# Patient Record
Sex: Female | Born: 1970 | Race: Black or African American | Hispanic: No | Marital: Married | Smoking: Never smoker
Health system: Southern US, Community
[De-identification: ages and names within clinical notes are randomized; demographics above are authoritative.]

## PROBLEM LIST (undated history)

## (undated) DIAGNOSIS — R748 Abnormal levels of other serum enzymes: Secondary | ICD-10-CM

## (undated) DIAGNOSIS — T7840XA Allergy, unspecified, initial encounter: Secondary | ICD-10-CM

## (undated) DIAGNOSIS — D696 Thrombocytopenia, unspecified: Secondary | ICD-10-CM

## (undated) DIAGNOSIS — N83209 Unspecified ovarian cyst, unspecified side: Secondary | ICD-10-CM

## (undated) DIAGNOSIS — E559 Vitamin D deficiency, unspecified: Secondary | ICD-10-CM

## (undated) DIAGNOSIS — Z8489 Family history of other specified conditions: Secondary | ICD-10-CM

## (undated) DIAGNOSIS — E785 Hyperlipidemia, unspecified: Secondary | ICD-10-CM

## (undated) DIAGNOSIS — B0229 Other postherpetic nervous system involvement: Secondary | ICD-10-CM

## (undated) HISTORY — DX: Allergy, unspecified, initial encounter: T78.40XA

## (undated) HISTORY — DX: Hyperlipidemia, unspecified: E78.5

## (undated) HISTORY — DX: Thrombocytopenia, unspecified: D69.6

## (undated) HISTORY — DX: Unspecified ovarian cyst, unspecified side: N83.209

## (undated) HISTORY — DX: Abnormal levels of other serum enzymes: R74.8

## (undated) HISTORY — DX: Vitamin D deficiency, unspecified: E55.9

---

## 2000-04-18 HISTORY — PX: TUBAL LIGATION: SHX77

## 2001-04-18 HISTORY — PX: BREAST SURGERY: SHX581

## 2004-02-24 ENCOUNTER — Emergency Department: Payer: Self-pay | Admitting: Internal Medicine

## 2006-11-02 ENCOUNTER — Emergency Department: Payer: Self-pay | Admitting: Emergency Medicine

## 2009-02-12 ENCOUNTER — Ambulatory Visit: Payer: Self-pay

## 2009-06-16 ENCOUNTER — Ambulatory Visit: Payer: Self-pay | Admitting: Internal Medicine

## 2009-07-13 ENCOUNTER — Ambulatory Visit: Payer: Self-pay | Admitting: Internal Medicine

## 2009-07-17 ENCOUNTER — Ambulatory Visit: Payer: Self-pay | Admitting: Internal Medicine

## 2009-08-16 ENCOUNTER — Ambulatory Visit: Payer: Self-pay | Admitting: Internal Medicine

## 2009-09-16 ENCOUNTER — Ambulatory Visit: Payer: Self-pay | Admitting: Internal Medicine

## 2009-09-28 ENCOUNTER — Ambulatory Visit: Payer: Self-pay | Admitting: Internal Medicine

## 2009-10-16 ENCOUNTER — Ambulatory Visit: Payer: Self-pay | Admitting: Internal Medicine

## 2009-11-17 ENCOUNTER — Ambulatory Visit: Payer: Self-pay | Admitting: Internal Medicine

## 2009-12-17 ENCOUNTER — Ambulatory Visit: Payer: Self-pay | Admitting: Internal Medicine

## 2010-01-16 ENCOUNTER — Ambulatory Visit: Payer: Self-pay | Admitting: Internal Medicine

## 2010-03-15 ENCOUNTER — Ambulatory Visit: Payer: Self-pay | Admitting: Internal Medicine

## 2010-03-18 ENCOUNTER — Ambulatory Visit: Payer: Self-pay | Admitting: Internal Medicine

## 2010-04-05 ENCOUNTER — Ambulatory Visit: Payer: Self-pay

## 2010-07-30 ENCOUNTER — Ambulatory Visit: Payer: Self-pay | Admitting: Internal Medicine

## 2010-08-17 ENCOUNTER — Ambulatory Visit: Payer: Self-pay | Admitting: Internal Medicine

## 2011-04-13 ENCOUNTER — Ambulatory Visit: Payer: Self-pay

## 2012-04-16 ENCOUNTER — Ambulatory Visit: Payer: Self-pay

## 2012-06-14 ENCOUNTER — Ambulatory Visit: Payer: Self-pay | Admitting: Anesthesiology

## 2012-07-12 ENCOUNTER — Ambulatory Visit: Payer: Self-pay | Admitting: Anesthesiology

## 2012-07-31 ENCOUNTER — Ambulatory Visit: Payer: Self-pay | Admitting: Anesthesiology

## 2012-11-27 ENCOUNTER — Ambulatory Visit: Payer: Self-pay | Admitting: Anesthesiology

## 2013-04-17 ENCOUNTER — Ambulatory Visit: Payer: Self-pay

## 2013-11-05 ENCOUNTER — Ambulatory Visit: Payer: Self-pay | Admitting: Family Medicine

## 2014-06-09 ENCOUNTER — Ambulatory Visit: Payer: Self-pay

## 2014-06-19 ENCOUNTER — Ambulatory Visit: Payer: Self-pay

## 2014-06-27 ENCOUNTER — Ambulatory Visit: Payer: Self-pay | Admitting: Gastroenterology

## 2015-02-16 ENCOUNTER — Telehealth: Payer: Self-pay | Admitting: Family Medicine

## 2015-02-17 NOTE — Telephone Encounter (Signed)
Spoke with patient, she is requesting copies of he office notes from 2013. Printed and placed for her to pick up.

## 2015-02-17 NOTE — Telephone Encounter (Signed)
Pt would like a call back wouldn't tell me anything sorry

## 2015-03-09 ENCOUNTER — Telehealth: Payer: Self-pay

## 2015-03-09 NOTE — Telephone Encounter (Signed)
Pt mailed letter requesting a letter to return to work.  See copy on file.  LMOM advising patient that she will need to see a disability provider for determination.

## 2015-04-03 ENCOUNTER — Telehealth: Payer: Self-pay

## 2015-04-03 NOTE — Telephone Encounter (Signed)
Patient is requesting a letter stating that she is okay to return to work.  Patient has brought paper work (in my office) for you to review as supporting documentation for her request.

## 2015-04-06 NOTE — Telephone Encounter (Signed)
done

## 2015-04-07 NOTE — Telephone Encounter (Signed)
Please let Yolanda Tran know that I'd like to see patient for an appointment here in the office for:  Letter, but also cholesterol, general follow-up Please schedule a visit with me  in the next: few weeks Fasting?  Yes please Thank you, Dr. Sherie DonLada

## 2015-04-08 NOTE — Telephone Encounter (Signed)
Patient has appointment on 04/17/15.

## 2015-04-16 DIAGNOSIS — D696 Thrombocytopenia, unspecified: Secondary | ICD-10-CM | POA: Insufficient documentation

## 2015-04-16 DIAGNOSIS — E559 Vitamin D deficiency, unspecified: Secondary | ICD-10-CM | POA: Insufficient documentation

## 2015-04-16 DIAGNOSIS — R748 Abnormal levels of other serum enzymes: Secondary | ICD-10-CM | POA: Insufficient documentation

## 2015-04-16 DIAGNOSIS — E785 Hyperlipidemia, unspecified: Secondary | ICD-10-CM | POA: Insufficient documentation

## 2015-04-17 ENCOUNTER — Ambulatory Visit (INDEPENDENT_AMBULATORY_CARE_PROVIDER_SITE_OTHER): Payer: Self-pay | Admitting: Family Medicine

## 2015-04-17 ENCOUNTER — Encounter: Payer: Self-pay | Admitting: Family Medicine

## 2015-04-17 VITALS — BP 131/85 | HR 98 | Temp 97.9°F | Ht 65.5 in | Wt 132.0 lb

## 2015-04-17 DIAGNOSIS — M5417 Radiculopathy, lumbosacral region: Secondary | ICD-10-CM

## 2015-04-17 DIAGNOSIS — D696 Thrombocytopenia, unspecified: Secondary | ICD-10-CM

## 2015-04-17 DIAGNOSIS — R748 Abnormal levels of other serum enzymes: Secondary | ICD-10-CM

## 2015-04-17 DIAGNOSIS — E559 Vitamin D deficiency, unspecified: Secondary | ICD-10-CM

## 2015-04-17 DIAGNOSIS — E785 Hyperlipidemia, unspecified: Secondary | ICD-10-CM

## 2015-04-17 DIAGNOSIS — M5416 Radiculopathy, lumbar region: Secondary | ICD-10-CM

## 2015-04-17 NOTE — Assessment & Plan Note (Addendum)
Recheck that today along with alk phos isoenzymes

## 2015-04-17 NOTE — Assessment & Plan Note (Addendum)
Check CBC today; previously evaluated at the cancer center by hematologist
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
## 2015-04-17 NOTE — Assessment & Plan Note (Addendum)
Check fasting lipids today; she has not been taking statin for a long time

## 2015-04-17 NOTE — Assessment & Plan Note (Addendum)
Check vit D and supplement as needed

## 2015-04-17 NOTE — Patient Instructions (Addendum)
We are going to need records from: Dr. Marlowe AschoffKathryn Egerton Dr. Tressie StalkerJeffrey Jenkins Dr. Selina CooleyJames [facility] Dr. Jeannetta EllisBeschel chiropractor Disability Determination Services [place]

## 2015-04-18 ENCOUNTER — Encounter: Payer: Self-pay | Admitting: Family Medicine

## 2015-04-18 DIAGNOSIS — M5416 Radiculopathy, lumbar region: Secondary | ICD-10-CM | POA: Insufficient documentation

## 2015-04-18 LAB — CBC WITH DIFFERENTIAL/PLATELET
Basophils Absolute: 0 10*3/uL (ref 0.0–0.2)
Basos: 0 %
EOS (ABSOLUTE): 0.1 10*3/uL (ref 0.0–0.4)
Eos: 1 %
Hematocrit: 39.2 % (ref 34.0–46.6)
Hemoglobin: 13 g/dL (ref 11.1–15.9)
Immature Grans (Abs): 0 10*3/uL (ref 0.0–0.1)
Immature Granulocytes: 0 %
Lymphocytes Absolute: 1.6 10*3/uL (ref 0.7–3.1)
Lymphs: 23 %
MCH: 28.4 pg (ref 26.6–33.0)
MCHC: 33.2 g/dL (ref 31.5–35.7)
MCV: 86 fL (ref 79–97)
Monocytes Absolute: 0.6 10*3/uL (ref 0.1–0.9)
Monocytes: 9 %
Neutrophils Absolute: 4.4 10*3/uL (ref 1.4–7.0)
Neutrophils: 67 %
Platelets: 126 10*3/uL — ABNORMAL LOW (ref 150–379)
RBC: 4.57 x10E6/uL (ref 3.77–5.28)
RDW: 14.1 % (ref 12.3–15.4)
WBC: 6.6 10*3/uL (ref 3.4–10.8)

## 2015-04-18 LAB — LIPID PANEL W/O CHOL/HDL RATIO
Cholesterol, Total: 221 mg/dL — ABNORMAL HIGH (ref 100–199)
HDL: 61 mg/dL (ref >39)
LDL Calculated: 147 mg/dL — ABNORMAL HIGH (ref 0–99)
Triglycerides: 64 mg/dL (ref 0–149)
VLDL Cholesterol Cal: 13 mg/dL (ref 5–40)

## 2015-04-18 LAB — COMPREHENSIVE METABOLIC PANEL
ALT: 15 IU/L (ref 0–32)
AST: 22 IU/L (ref 0–40)
Albumin/Globulin Ratio: 1.9 (ref 1.1–2.5)
Albumin: 4.3 g/dL (ref 3.5–5.5)
Alkaline Phosphatase: 86 IU/L (ref 39–117)
BUN/Creatinine Ratio: 15 (ref 9–23)
BUN: 11 mg/dL (ref 6–24)
Bilirubin Total: 0.5 mg/dL (ref 0.0–1.2)
CO2: 22 mmol/L (ref 18–29)
Calcium: 8.8 mg/dL (ref 8.7–10.2)
Chloride: 103 mmol/L (ref 96–106)
Creatinine, Ser: 0.73 mg/dL (ref 0.57–1.00)
GFR calc Af Amer: 114 mL/min/{1.73_m2} (ref >59)
GFR calc non Af Amer: 99 mL/min/{1.73_m2} (ref >59)
Globulin, Total: 2.3 g/dL (ref 1.5–4.5)
Glucose: 87 mg/dL (ref 65–99)
Potassium: 4.1 mmol/L (ref 3.5–5.2)
Sodium: 141 mmol/L (ref 134–144)
Total Protein: 6.6 g/dL (ref 6.0–8.5)

## 2015-04-18 LAB — VITAMIN D 25 HYDROXY (VIT D DEFICIENCY, FRACTURES): Vit D, 25-Hydroxy: 40.5 ng/mL (ref 30.0–100.0)

## 2015-04-18 NOTE — Assessment & Plan Note (Signed)
Patient previously on short-term and then long-term disability for her back apparently; she filed for permanent disability and is now seeking letter saying she can return to work for some sort of mortgage relief; I explained that I am going to need records from these previous treating doctors, since I had nothing to do with her previous treatments or evaluations or work excuses or limitations; I am going to need more information before I completely reverse something that was apparently so bad that she filed for permanent disability to say that now everything is fine; I still do not know why permanent disability was applied for or why her neurosurgeon was not on board with this; we are going to request multiples notes from the neurosurgeon, the chiropractor, the pain clinic, the physical therapist; I am also going to need the MRI and lumbar spine xrays that were apparently done; patient may return in two weeks to go over what information we're able to obtain and do physical exam at that time to see if there are ongoing limitations

## 2015-04-21 LAB — ALKALINE PHOSPHATASE, ISOENZYMES
BONE FRACTION: 24 % (ref 14–68)
INTESTINAL FRAC.: 0 % (ref 0–18)
LIVER FRACTION: 76 % (ref 18–85)

## 2015-05-01 ENCOUNTER — Ambulatory Visit (INDEPENDENT_AMBULATORY_CARE_PROVIDER_SITE_OTHER): Payer: BLUE CROSS/BLUE SHIELD | Admitting: Family Medicine

## 2015-05-01 ENCOUNTER — Encounter: Payer: Self-pay | Admitting: Family Medicine

## 2015-05-01 VITALS — BP 132/81 | HR 89 | Temp 97.4°F | Wt 131.0 lb

## 2015-05-01 DIAGNOSIS — Z5321 Procedure and treatment not carried out due to patient leaving prior to being seen by health care provider: Secondary | ICD-10-CM | POA: Insufficient documentation

## 2015-05-01 NOTE — Progress Notes (Signed)
*  Patient came in today to go over multiple records However, we do not have the records and this was not realized until she was already roomed and I walked in Staff member will take it upon herself to make sure these get requested Patient was asked to call back up here a couple of days before her next appointment (1-2 weeks) and make sure we have the needed records so she does not waste a trip Dr. Sherie DonLada

## 2015-05-12 ENCOUNTER — Ambulatory Visit: Payer: BLUE CROSS/BLUE SHIELD | Admitting: Family Medicine

## 2015-05-13 ENCOUNTER — Telehealth: Payer: Self-pay | Admitting: Family Medicine

## 2015-05-13 NOTE — Telephone Encounter (Signed)
Patient called wanted to know if we have received all the records we had requested. If we have not should she keep or cancel her appt tomorrow. Please call pt to advise. Patient also stated there is one more place that evaluated her: [facility] and the contact info is as follows: [phone] or [phone]. Thanks.

## 2015-05-14 ENCOUNTER — Ambulatory Visit: Payer: BLUE CROSS/BLUE SHIELD | Admitting: Family Medicine

## 2015-05-14 NOTE — Telephone Encounter (Signed)
Patient called back, we decided to push out her appointment since we only have 1 set of notes. She will stop by this afternoon and will sign a release for the [facility] office.

## 2015-05-14 NOTE — Telephone Encounter (Signed)
I only can find notes from the pain clinic.  Left message for patient.

## 2015-05-22 ENCOUNTER — Ambulatory Visit: Payer: Self-pay | Admitting: Family Medicine

## 2015-06-01 ENCOUNTER — Ambulatory Visit (INDEPENDENT_AMBULATORY_CARE_PROVIDER_SITE_OTHER): Payer: BLUE CROSS/BLUE SHIELD | Admitting: Family Medicine

## 2015-06-01 ENCOUNTER — Encounter: Payer: Self-pay | Admitting: Family Medicine

## 2015-06-01 VITALS — BP 113/76 | HR 84 | Temp 98.4°F | Ht 65.0 in | Wt 131.6 lb

## 2015-06-01 DIAGNOSIS — M5416 Radiculopathy, lumbar region: Secondary | ICD-10-CM

## 2015-06-01 DIAGNOSIS — M5417 Radiculopathy, lumbosacral region: Secondary | ICD-10-CM

## 2015-06-01 NOTE — Assessment & Plan Note (Addendum)
I am not confident in writing a letter for this patient; she was apparently so disabled that she did not work at all for two years; now she is wanting a letter reversing that and saying she can work; I do not feel comfortable writing a letter for the purpose of her receiving financial gain for a mortgage program given the limited amount of information I have; I am not trained in disability evaluations, but will refer her to [facility] for evaluation; I explained that if I write her a letter today saying that she is not disabled, I personally don't know what that does to her previous insurance / disability payments or if she would have to repay those; requesting records again today, but I will not be the physician to write her a final letter for her mortgage payment program

## 2015-06-01 NOTE — Progress Notes (Signed)
BP 113/76 mmHg  Pulse 84  Temp(Src) 98.4 F (36.9 C)  Ht  (1.651 m)  Wt 131 lb 9.6 oz (59.693 kg)  BMI 21.90 kg/m2  SpO2 99%  LMP 05/21/2015 (Exact Date)   Subjective:    Patient ID: Yolanda Tran, female    DOB: 1970/08/22, 45 y.o.   MRN: [id number]  HPI: Yolanda Tran is a 45 y.o. female  Chief Complaint  Patient presents with  . Follow-up  . Other    Vitamin D Defiency   Patient was on disability for two years and now wants documentation that she can return to work to get help with her mortgage  Patient is here for review of records, but we are missing records from several of her providers; I do not have any physical therapy; we reviewed what we had today, which was minimal; I had the MRI report and one note from Dr. 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; she says she cannot do a standing job wearing safety boots She feels like she can go back to work on her own; no one would give her the release to go back to work; she has been bouncing around from one provider to another to get permission to go back to work; everybody says she has to go to her primary; her primary was only for short-term disability The patient says the long-term disability was through her insurance; no doctor ever actually said she was disabled Her representative at [employer] was who said she was eligible to receive benefits for long-term disability; first part of 2014 through early 2016; then those benefits ended At the end of that, then she ran out of benefits, she could not extend long-term disability and had  to go back to work She did not feel 100% She can do what she is doing now; she is Engineer, 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; she still has pain, little shooting pain, not as much, just a catch in her back or has a headache; has some of the symptoms as she did before on LTD, but not enough to keep her in the house 24/7 either She says she has to work because her disability payments ran out She says things have changed; circumstances have changed; things are not as severe as they were  Current symptoms include:   She can "vividly remember" then compared to what she has now, she says When she lays down and gets up, her head hurts a little bit It takes her a moment to get up, her head weighs 20 pounds; not sure if cramping or striffness; primarily the head, head just feels so heavy; has some sensations of shooting pain in the left leg, not as frequent as it used to be; very cold temperature will trigger that; every now and then; the pain is down the left leg from thigh to the shin; some lower back still bothers her; not every day; just above the buttocks; primarily in the middle No loss of control of the bowel or bladder; urinates more frequently,  no accidents The left leg does not feel heavy; not any more, but did walk a little funny back during her disability, but not heavy No atrophy of the left leg relative to the right No electrical feeling or symptoms down the leg  ------------------------ We are going to need records from: Dr. Marlowe Aschoff* Dr. Tressie Stalker (one note) Dr. Gwenyth Bender [facility] * Dr. Jeannetta Ellis chiropractor * Disability Determination Services * MRI and lumbar spine xrays done in [facility] (MRI from Jan 2014)  -------------------- Dr. Chanetta Marshall was the foot doctor; no notes from her [facility] neurosurgical Jan 2014; reviewed, lumbago and radiculopathy; then got MRI MRI showed disc protrusion L2-L3 Did not need  surgery; was referred to pain clinic  Went to [facility] Feb 2016 and we reviewed that note as well  Relevant past medical, surgical and social history reviewed Allergies and medications reviewed and updated.  Review of Systems  Per HPI unless specifically indicated above     Objective:    BP 113/76 mmHg  Pulse 84  Temp(Src) 98.4 F (36.9 C)  Ht  (1.651 m)  Wt 131 lb 9.6 oz (59.693 kg)  BMI 21.90 kg/m2  SpO2 99%  LMP 05/21/2015 (Exact Date)  Wt Readings from Last 3 Encounters:  06/01/15 131 lb 9.6 oz (59.693 kg)  05/01/15 131 lb (59.421 kg)  04/17/15 132 lb (59.875 kg)    Physical Exam  Constitutional: She appears well-developed and well-nourished.  Musculoskeletal:       Lumbar back: She exhibits normal range of motion and no deformity.  Neurological:  Reflex Scores:      Patellar reflexes are 2+ on the right side and 2+ on the left side. 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 - Primary    I am not confident in writing a letter for this patient; she was apparently so disabled that she did not work at all for two years; now she is wanting a letter reversing that and saying she can work; I do not feel comfortable writing a letter for the purpose of her receiving financial gain for a mortgage program given the limited amount of information I have; I am not trained in disability evaluations, but will refer her to [facility]R for evaluation; I explained that if I write her a letter today saying that she is not disabled, I personally don't know what that does to her previous insurance / disability payments or if she would have to repay those; requesting records again today, but I will not be the physician to write her a final letter for her mortgage  payment program      Relevant Orders   Ambulatory referral to Physical Medicine Rehab      Follow up plan: No Follow-up on file.  Face-to-face time with patient was more than 25 minutes, >50% time spent counseling and coordination of care  Orders Placed This Encounter  Procedures  . Ambulatory referral to Physical Medicine Rehab

## 2015-06-01 NOTE — Patient Instructions (Addendum)
We'll refer you to physical medicine and rehabilitation for evaluation and return to work assessment and documentation; the authorization to return to work will come from that specialist  We are going to still need records from: Dr. Marlowe Aschoff* Dr. Gwenyth Bender [facility] * Dr. Jeannetta Ellis chiropractor * Disability Determination Services *

## 2015-06-08 ENCOUNTER — Encounter: Payer: Self-pay | Admitting: Physical Therapy

## 2015-06-08 ENCOUNTER — Ambulatory Visit: Payer: BLUE CROSS/BLUE SHIELD | Attending: Family Medicine | Admitting: Physical Therapy

## 2015-06-08 DIAGNOSIS — M542 Cervicalgia: Secondary | ICD-10-CM | POA: Diagnosis present

## 2015-06-08 DIAGNOSIS — R293 Abnormal posture: Secondary | ICD-10-CM

## 2015-06-08 DIAGNOSIS — M6281 Muscle weakness (generalized): Secondary | ICD-10-CM | POA: Diagnosis present

## 2015-06-08 DIAGNOSIS — M5442 Lumbago with sciatica, left side: Secondary | ICD-10-CM

## 2015-06-08 DIAGNOSIS — M436 Torticollis: Secondary | ICD-10-CM

## 2015-06-08 NOTE — Therapy (Signed)
[place] [facility] [address] [place], [place], [address] Phone: [phone]   Fax:  [phone]  Physical Therapy Evaluation  Patient Details  Name: Yolanda Tran MRN: [id number] Date of Birth: [birth year] Referring Provider: Dr. Sherie Don  Encounter Date: 06/08/2015      PT End of Session - 06/08/15 1136    Visit Number 1   Number of Visits 8   Date for PT Re-Evaluation 07/06/15   PT Start Time 1113   PT Stop Time 1214   PT Time Calculation (min) 61 min   Activity Tolerance Patient tolerated treatment well   Behavior During Therapy [facility] for tasks assessed/performed      Past Medical History  Diagnosis Date  . Hyperlipidemia   . Thrombocytopenia (HCC)   . Vitamin D deficiency disease   . Alkaline phosphatase elevation     Past Surgical History  Procedure Laterality Date  . Tubal ligation  2002  . Breast surgery  2003    tissue removed from breast    There were no vitals filed for this visit.  Visit Diagnosis:  Left-sided low back pain with left-sided sciatica  Abnormal posture  Cervical pain  Stiffness of cervical spine  Muscle weakness      Subjective Assessment - 06/08/15 1125    Subjective Pt. reports she is doing pretty well today and states cold weather really aggravates her symptoms.  Pt. sleeps on L/R sides with use of pillow between knees.     Limitations Sitting;Standing;Walking   Diagnostic tests Pt. 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    Patient Stated Goals Decrease pain with all tasks   Currently in Pain? Yes   Pain Score 1    Pain Location Back   Pain Orientation Left;Lower   Pain Descriptors / Indicators Burning;Sharp   Pain Type Chronic pain   Pain Onset More than a month ago   Pain Frequency Intermittent   Aggravating Factors  cooler/cold weather and fluctuation of barometric pressure aggravates low back.     Pain Relieving Factors taking warm showers or using a heating  pad. Warm weather makes her feel better            [facility] PT Assessment - 06/08/15 0001    Assessment   Medical Diagnosis Low back pain   Referring Provider Dr. Sherie Don   Onset Date/Surgical Date 10/17/11   Prior Therapy yes   Precautions   Precautions None   Restrictions   Weight Bearing Restrictions No   Balance Screen   Has the patient fallen in the past 6 months No   Has the patient had a decrease in activity level because of a fear of falling?  No   Is the patient reluctant to leave their home because of a fear of falling?  No       Lumbar AROM: flexion WNL, ext. WNL, R lateral flexion (L "catching" pain, 6/10 pain), L lateral flexion WNL, L rotn. (slight "catch" on L side 3/10 pain), R rotn. WNL.   Hip MMT: R flex: 4/5. L flex: 4/5. R ER: 4+/5. L ER: 4/5. R IR: 4+/5. L IR: 4/5. Abduction and adduction: 5/5.  Manual: Hamstring and piriformis length: WNL. Pt reports slight anterior thigh pain with L piriformis stretch. Grade I/II CPAs/UPAs in lower cervical and upper thoacic spine with stiffness noted. Upper trap tightness also noted. Pt's lumbar spine demonstrates good mobility.  NDI: 20%. MODI: 18%. FABQPA: 4/24. FABQW:  0/42.         PT Education - 06/08/15 1431    Education provided Yes   Education Details see above handout. Pt also educated on importance of good posture.   Person(s) Educated Patient   Methods Explanation;Demonstration;Handout   Comprehension Verbalized understanding;Returned demonstration            PT Long Term Goals - 06/08/15 1454    PT LONG TERM GOAL #1   Title Pt will increase B hip flexion MMT strength to >4+/5 in order to safely ascend/descend stairs.   Baseline B hip flex: 4/5 on 2/20   Time 4   Period Weeks   Status New   PT LONG TERM GOAL #2   Title Pt will decrease MODI score to <10% to improve functional mobility with household tasks.   Baseline MODI: 18% on 2/20   Time 4   Period Weeks   Status New   PT LONG TERM GOAL #3    Title Pt will be able to sit for >30 mins with no reports of increased pain in order to complete occupational tasks.   Time 4   Period Weeks   Status New   PT LONG TERM GOAL #4   Title Pt will decrease NDI score to <10% to decrease self-percieved disability.   Baseline NDI: 20% on 2/20   Time 4   Period Weeks   Status New            Plan - 06/08/15 1431    Clinical Impression Statement Pt is a pleasant 45 y/o female who presents to PT with chronic low back pain which occasionally radiates down L LE. 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 Her job entailed standing and moving a lot, something she was not used to. Pt reports the pain began in her L knee/foot and worked its way to her lower back. At that time her MD restricted her hours and she was put on short term disability which eventually become long term disability (ended in Feb 2016). She has tried PT and a Land in the past which has helped significantly, but states she continues to feel pain when the weather outside is wet and rainy. Pt reports 1/10 pain currently but the worst pain can get up to a 5/10 sharp/burning pain. Pt reports symptoms in L LE come on unexpectedly like a jolt when it is cold outside. Pt reports she is able to sleep well on her sides with a pillow between her knees but occasionally wakes up at night due to LBP. Pt also reports feeling neck/lower back stiffness in the morning which decreases after a warm shower. Pt currently has no issues with housework, she is able to vaccum, do the laundry, wash dishes, etc. with no complaints of increased pain. Pt does not currently exercise consistently but tries to walk outside 2-3 x/week. Pt currently works as an Advertising account planner which entails lots of sitting in front of a computer and driving around. Pt reports she is able to get up and walk around every so often if she needs to. Repeated movements: R side bend: 6/10 pain on L side  with a "catching" feeling. L Rotation: 3/10 pain on L side with same "catching" feeling. No increase in pain with ext., flex, L side bend, and R rotation. Hip MMT: R flex: 4/5. L flex: 4/5. R ER: 4+/5. L ER: 4/5. R IR: 4+/5. L IR: 4/5. Abduction and  adduction: 5/5. Hamstring and piriformis length were WNL. Pt reports slight anterior thigh pain with L piriformis stretch. Pt demonstrates increased lordosis in lower back. Pt reports she is aware of bad posture when sitting and working but has not worked on correcting it, pt was instruced on importance of good posture. Pt reports pain/discomfort with grade I/II CPAs/UPAs in lower cervical and upper thoacic spine with stiffness noted. Upper trap tightness also noted. Pt's lumbar spine demonstrates good mobility. NDI: 20%. MODI: 18%. FABQPA: 4/24. FABQW: 0/42. Pt will benefit from skilled PT services to improve cervical/thoracic mobility and increase lumbar strengt in order to [facility] with less pain.   Pt will benefit from skilled therapeutic intervention in order to improve on the following deficits Decreased activity tolerance;Decreased endurance;Decreased mobility;Decreased strength;Hypomobility;Postural dysfunction;Improper body mechanics;Pain;Increased fascial restricitons;Decreased range of motion   Rehab Potential Good   PT Frequency 2x / week   PT Duration 4 weeks   PT Treatment/Interventions ADLs/Self Care Home Management;Cryotherapy;Electrical Stimulation;Moist Heat;Ultrasound;Gait training;Stair training;Functional mobility training;Therapeutic activities;Therapeutic exercise;Balance training;Patient/family education;Manual techniques   PT Next Visit Plan progress core activation program   PT Home Exercise Plan see above handout   Consulted and Agree with Plan of Care Patient         Problem List Patient Active Problem List   Diagnosis Date Noted  . Patient left without being seen 05/01/2015  . Lumbar back pain with radiculopathy affecting  left lower extremity 04/18/2015  . Hyperlipidemia   . Thrombocytopenia (HCC)   . Vitamin D deficiency disease   . Alkaline phosphatase elevation    Cammie Mcgee, PT, DPT # [phone]   06/08/2015, 3:26 PM  [facility] [address] [place], [place], [address] Phone: [phone]   Fax:  [phone]  Name: CATHERINE CUBERO MRN: [id number] Date of Birth: March 25, 1971

## 2015-06-11 ENCOUNTER — Encounter: Payer: BLUE CROSS/BLUE SHIELD | Admitting: Physical Therapy

## 2015-06-11 ENCOUNTER — Ambulatory Visit: Payer: BLUE CROSS/BLUE SHIELD | Admitting: Physical Therapy

## 2015-06-11 DIAGNOSIS — M5442 Lumbago with sciatica, left side: Secondary | ICD-10-CM

## 2015-06-11 DIAGNOSIS — M6281 Muscle weakness (generalized): Secondary | ICD-10-CM

## 2015-06-11 DIAGNOSIS — R293 Abnormal posture: Secondary | ICD-10-CM

## 2015-06-11 DIAGNOSIS — M436 Torticollis: Secondary | ICD-10-CM

## 2015-06-11 DIAGNOSIS — M542 Cervicalgia: Secondary | ICD-10-CM

## 2015-06-12 NOTE — Therapy (Addendum)
[facility] [address] [place], [place], [address] Phone: [phone]   Fax:  [phone]  Physical Therapy Treatment  Patient Details  Name: SHYANNE MCCLARY MRN: [id number] Date of Birth: 05/22/1970 Referring Provider: Dr. Sherie Don  Encounter Date: 06/11/2015    Past Medical History  Diagnosis Date  . Hyperlipidemia   . Thrombocytopenia (HCC)   . Vitamin D deficiency disease   . Alkaline phosphatase elevation     Past Surgical History  Procedure Laterality Date  . Tubal ligation  2002  . Breast surgery  2003    tissue removed from breast    There were no vitals filed for this visit.  Visit Diagnosis:  Left-sided low back pain with left-sided sciatica  Abnormal posture  Cervical pain  Stiffness of cervical spine  Muscle weakness      Subjective Assessment - 06/15/15 1230    Subjective Pt. states she is doing well today.  No c/o pain at this time.  Pt. reports pain is weather related.    Limitations Sitting;Standing;Walking   Diagnostic tests Pt. 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    Patient Stated Goals Decrease pain with all tasks   Currently in Pain? No/denies        OBJECTIVE:  Manual tx.: supine cervical traction/ UT/ levator stretches 3x each (good mobility/ no pain).  Supine LE/lumbar stretches 7 min.  There.ex.: Page #2-3 (good core muscle contraction)- no pain.  Supine ball ex. (knee to chest/ bridging/ bridging with knee to chest)- min. Ball stability.  Reviewed core ex. Program.     Pt response for medical necessity:  Progressing well with core stability and cervical AROM all planes.  No increase c/o pain t/o tx.      Good cervical AROM all planes with no c/o tenderness/pain in supine position.  Pt. able to progress to pg.#2-3 of core stability program with good technique/core control.   Minimal ball support towards end of tx. with supine core program due to fatigue/ unstable  (no pain).           PT Long Term Goals - 06/08/15 1454    PT LONG TERM GOAL #1   Title Pt will increase B hip flexion MMT strength to >4+/5 in order to safely ascend/descend stairs.   Baseline B hip flex: 4/5 on 2/20   Time 4   Period Weeks   Status New   PT LONG TERM GOAL #2   Title Pt will decrease MODI score to <10% to improve functional mobility with household tasks.   Baseline MODI: 18% on 2/20   Time 4   Period Weeks   Status New   PT LONG TERM GOAL #3   Title Pt will be able to sit for >30 mins with no reports of increased pain in order to complete occupational tasks.   Time 4   Period Weeks   Status New   PT LONG TERM GOAL #4   Title Pt will decrease NDI score to <10% to decrease self-percieved disability.   Baseline NDI: 20% on 2/20   Time 4   Period Weeks   Status New       Problem List Patient Active Problem List   Diagnosis Date Noted  . Patient left without being seen 05/01/2015  . Lumbar back pain with radiculopathy affecting left lower extremity 04/18/2015  . Hyperlipidemia   . Thrombocytopenia (HCC)   . Vitamin D deficiency disease   .  Alkaline phosphatase elevation    Cammie Mcgee, PT, DPT # [phone]   06/15/2015, 12:38 PM  [facility] [address] [place], [place], [address] Phone: [phone]   Fax:  [phone]  Name: BRITTAIN SMITHEY MRN: [id number] Date of Birth: 26-Feb-1971

## 2015-06-15 ENCOUNTER — Ambulatory Visit: Payer: BLUE CROSS/BLUE SHIELD | Admitting: Physical Therapy

## 2015-06-15 DIAGNOSIS — M5442 Lumbago with sciatica, left side: Secondary | ICD-10-CM

## 2015-06-15 DIAGNOSIS — M6281 Muscle weakness (generalized): Secondary | ICD-10-CM

## 2015-06-15 DIAGNOSIS — R293 Abnormal posture: Secondary | ICD-10-CM

## 2015-06-15 DIAGNOSIS — M436 Torticollis: Secondary | ICD-10-CM

## 2015-06-15 DIAGNOSIS — M542 Cervicalgia: Secondary | ICD-10-CM

## 2015-06-15 NOTE — Patient Instructions (Signed)
Page #2-3 TrA ex. Program.

## 2015-06-16 ENCOUNTER — Encounter: Payer: Self-pay | Admitting: Physical Therapy

## 2015-06-16 NOTE — Therapy (Signed)
[facility] [address] [place], [place], [address] Phone: [phone]   Fax:  [phone]  Physical Therapy Treatment  Patient Details  Name: Yolanda Tran MRN: [id number] Date of Birth: Jul 15, 1970 Referring Provider: Dr. Sherie Don  Encounter Date: 06/15/2015      PT End of Session - 06/16/15 0839    Visit Number 3   Number of Visits 8   Date for PT Re-Evaluation 07/06/15   PT Start Time 1301   PT Stop Time 1357   PT Time Calculation (min) 56 min   Activity Tolerance Patient tolerated treatment well   Behavior During Therapy [facility] for tasks assessed/performed      Past Medical History  Diagnosis Date  . Hyperlipidemia   . Thrombocytopenia (HCC)   . Vitamin D deficiency disease   . Alkaline phosphatase elevation     Past Surgical History  Procedure Laterality Date  . Tubal ligation  2002  . Breast surgery  2003    tissue removed from breast    There were no vitals filed for this visit.  Visit Diagnosis:  Left-sided low back pain with left-sided sciatica  Abnormal posture  Cervical pain  Stiffness of cervical spine  Muscle weakness      Subjective Assessment - 06/16/15 0835    Subjective Pt. reports increase stiffness/pain (L hip soreness) on Saturday and states it may be due to increase activity on Friday for work (increaese driving/ getting in and out of car).  Pt. reports she is doing well today.  Pt. didn't do HEP over weekend due to increase discomfort on Saturday from work.     Limitations Sitting;Standing;Walking   Diagnostic tests Pt. 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    Patient Stated Goals Decrease pain with all tasks   Currently in Pain? No/denies      OBJECTIVE: Manual tx.: supine cervical traction/ UT/ levator stretches 3x each (good mobility/ no pain). Supine LE/lumbar stretches with L hip assessment (good hip capsule mobility).  Prone hip/lumbar STM. There.ex.:  Discussed/reviewed core muscle contraction/ HEP. Supine ball ex. (knee to chest/ bridging).  Supine/sidelying hip clamshell ex. With GTB 20x (pain-free range).  Seated ball core stability ex. (pelvic clocks/ tilts/ marching/ LAQ).    Pt response for medical necessity: Progressing well with core stability and cervical AROM all planes. No increase c/o pain t/o tx.        PT Long Term Goals - 06/08/15 1454    PT LONG TERM GOAL #1   Title Pt will increase B hip flexion MMT strength to >4+/5 in order to safely ascend/descend stairs.   Baseline B hip flex: 4/5 on 2/20   Time 4   Period Weeks   Status New   PT LONG TERM GOAL #2   Title Pt will decrease MODI score to <10% to improve functional mobility with household tasks.   Baseline MODI: 18% on 2/20   Time 4   Period Weeks   Status New   PT LONG TERM GOAL #3   Title Pt will be able to sit for >30 mins with no reports of increased pain in order to complete occupational tasks.   Time 4   Period Weeks   Status New   PT LONG TERM GOAL #4   Title Pt will decrease NDI score to <10% to decrease self-percieved disability.   Baseline NDI: 20% on 2/20   Time 4   Period Weeks  Status New            Plan - 06/16/15 0840    Clinical Impression Statement Cervical/LE/lumbar AROM WNL all planes (great flexibility noted).  No cervical limitations noted during supine manual tx.  Pt. will benefit from progression to core stability/ strength training at this time.  PT discussed importance of strengthening and consistency of program to prevent chronic pain/joint stiffness.    Pt will benefit from skilled therapeutic intervention in order to improve on the following deficits Decreased activity tolerance;Decreased endurance;Decreased mobility;Decreased strength;Hypomobility;Postural dysfunction;Improper body mechanics;Pain;Increased fascial restricitons;Decreased range of motion   Rehab Potential Good   PT Frequency 2x / week   PT  Duration 4 weeks   PT Treatment/Interventions ADLs/Self Care Home Management;Cryotherapy;Electrical Stimulation;Moist Heat;Ultrasound;Gait training;Stair training;Functional mobility training;Therapeutic activities;Therapeutic exercise;Balance training;Patient/family education;Manual techniques   PT Next Visit Plan progress core activation program   PT Home Exercise Plan see above handout   Consulted and Agree with Plan of Care Patient        Problem List Patient Active Problem List   Diagnosis Date Noted  . Patient left without being seen 05/01/2015  . Lumbar back pain with radiculopathy affecting left lower extremity 04/18/2015  . Hyperlipidemia   . Thrombocytopenia (HCC)   . Vitamin D deficiency disease   . Alkaline phosphatase elevation    Cammie Mcgee, PT, DPT # [id number]   06/16/2015, 8:46 AM  [facility] [address] [place], [place], [address] Phone: [phone]   Fax:  [phone]  Name: Yolanda Tran MRN: [id number] Date of Birth: May 05, 1970

## 2015-06-17 ENCOUNTER — Encounter: Payer: Self-pay | Admitting: Physical Therapy

## 2015-06-17 ENCOUNTER — Ambulatory Visit: Payer: BLUE CROSS/BLUE SHIELD | Attending: Family Medicine | Admitting: Physical Therapy

## 2015-06-17 DIAGNOSIS — M436 Torticollis: Secondary | ICD-10-CM | POA: Insufficient documentation

## 2015-06-17 DIAGNOSIS — M6281 Muscle weakness (generalized): Secondary | ICD-10-CM | POA: Insufficient documentation

## 2015-06-17 DIAGNOSIS — R293 Abnormal posture: Secondary | ICD-10-CM

## 2015-06-17 DIAGNOSIS — M5442 Lumbago with sciatica, left side: Secondary | ICD-10-CM | POA: Insufficient documentation

## 2015-06-17 DIAGNOSIS — M542 Cervicalgia: Secondary | ICD-10-CM | POA: Insufficient documentation

## 2015-06-17 NOTE — Therapy (Signed)
[facility] [address] [place], [place], [address] Phone: [phone]   Fax:  [phone]  Physical Therapy Treatment  Patient Details  Name: Yolanda Tran MRN: [id number] Date of Birth: March 02, 1971 Referring Provider: Dr. Sherie Don  Encounter Date: 06/17/2015      PT End of Session - 06/17/15 1301    Visit Number 4   Number of Visits 8   Date for PT Re-Evaluation 07/06/15   PT Start Time 1301   PT Stop Time 1351   PT Time Calculation (min) 50 min   Activity Tolerance Patient tolerated treatment well   Behavior During Therapy [facility] for tasks assessed/performed      Past Medical History  Diagnosis Date  . Hyperlipidemia   . Thrombocytopenia (HCC)   . Vitamin D deficiency disease   . Alkaline phosphatase elevation     Past Surgical History  Procedure Laterality Date  . Tubal ligation  2002  . Breast surgery  2003    tissue removed from breast    There were no vitals filed for this visit.  Visit Diagnosis:  Left-sided low back pain with left-sided sciatica  Abnormal posture  Cervical pain  Stiffness of cervical spine  Muscle weakness      Subjective Assessment - 06/17/15 1259    Subjective Pt. states she is doing well.  Pt. reports 3/10 neck pain currently with stiffness but "not bad".     Limitations Sitting;Standing;Walking   Diagnostic tests Pt. 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    Patient Stated Goals Decrease pain with all tasks   Currently in Pain? Yes   Pain Score 3    Pain Location Neck   Pain Orientation Right;Left;Lower   Pain Type Chronic pain      OBJECTIVE: Manual tx.: supine cervical traction/ UT/ levator stretches 3x each.  STM to B UT region in sitting with cervical AROM reassessment. Supine LE/lumbar stretches 6 min. (good B LE symmetry). Prone hip/lumbar STM and grade III unilateral lumbar mobs. 1x20 sec. Each level. There.ex.: Seated ball ex. (pelvic tilts/  marching/ LAQ with core activation and no UE assist). See new HEP (GTB scap. Retraction/ diagonals/ cervical retraction into pillow/ cervical isometric lateral flexion)- good technique and cuing to maintain proper head position.    Pt response for medical necessity: Progressing well with core stability and cervical AROM all planes. No increase c/o pain t/o tx. PT discussed writing a work note next tx.           PT Long Term Goals - 06/08/15 1454    PT LONG TERM GOAL #1   Title Pt will increase B hip flexion MMT strength to >4+/5 in order to safely ascend/descend stairs.   Baseline B hip flex: 4/5 on 2/20   Time 4   Period Weeks   Status New   PT LONG TERM GOAL #2   Title Pt will decrease MODI score to <10% to improve functional mobility with household tasks.   Baseline MODI: 18% on 2/20   Time 4   Period Weeks   Status New   PT LONG TERM GOAL #3   Title Pt will be able to sit for >30 mins with no reports of increased pain in order to complete occupational tasks.   Time 4   Period Weeks   Status New   PT LONG TERM GOAL #4   Title Pt will decrease NDI score to <10% to decrease self-percieved  disability.   Baseline NDI: 20% on 2/20   Time 4   Period Weeks   Status New            Plan - 06/17/15 1608    Clinical Impression Statement No c/o increase cervical pain or back discomfort with progression of ther.ex.  Pt. has full lumbar rotn. in supine position with a "good stretch" felt, not pain.  Pt. able to complete seated ball/ postural correction ex. program with good head position (no increase c/o pain).  Good lumbar mobility with no tenderness reported.  PT will focus on core strengthening.     Pt will benefit from skilled therapeutic intervention in order to improve on the following deficits Decreased activity tolerance;Decreased endurance;Decreased mobility;Decreased strength;Hypomobility;Postural dysfunction;Improper body mechanics;Pain;Increased fascial  restricitons;Decreased range of motion   Rehab Potential Good   PT Frequency 2x / week   PT Duration 4 weeks   PT Next Visit Plan progress core activation program.  Discuss work note with pt. and send to MD   PT Home Exercise Plan see above handout   Consulted and Agree with Plan of Care Patient        Problem List Patient Active Problem List   Diagnosis Date Noted  . Patient left without being seen 05/01/2015  . Lumbar back pain with radiculopathy affecting left lower extremity 04/18/2015  . Hyperlipidemia   . Thrombocytopenia (HCC)   . Vitamin D deficiency disease   . Alkaline phosphatase elevation    Cammie Mcgee, PT, DPT # [phone]   06/17/2015, 4:12 PM  [facility] [address] [place], [place], [address] Phone: [phone]   Fax:  [phone]  Name: KEYLEIGH MANNINEN MRN: [id number] Date of Birth: 18-Oct-1970

## 2015-06-22 ENCOUNTER — Encounter: Payer: Self-pay | Admitting: Physical Therapy

## 2015-06-22 ENCOUNTER — Encounter: Payer: BLUE CROSS/BLUE SHIELD | Admitting: Physical Therapy

## 2015-06-22 ENCOUNTER — Ambulatory Visit: Payer: BLUE CROSS/BLUE SHIELD | Admitting: Physical Therapy

## 2015-06-22 DIAGNOSIS — M436 Torticollis: Secondary | ICD-10-CM

## 2015-06-22 DIAGNOSIS — M5442 Lumbago with sciatica, left side: Secondary | ICD-10-CM

## 2015-06-22 DIAGNOSIS — M6281 Muscle weakness (generalized): Secondary | ICD-10-CM

## 2015-06-22 DIAGNOSIS — M542 Cervicalgia: Secondary | ICD-10-CM

## 2015-06-22 DIAGNOSIS — R293 Abnormal posture: Secondary | ICD-10-CM

## 2015-06-22 NOTE — Therapy (Signed)
[facility] [address] [place], [place], [address] Phone: [phone]   Fax:  [phone]  Physical Therapy Treatment  Patient Details  Name: Yolanda Tran MRN: [id number] Date of Birth: 1970-10-16 Referring Provider: Dr. Sherie DonLada  Encounter Date: 06/22/2015      PT End of Session - 06/22/15 1333    Visit Number 5   Number of Visits 8   Date for PT Re-Evaluation 07/06/15   PT Start Time 1119   PT Stop Time 1203   PT Time Calculation (min) 44 min   Activity Tolerance Patient tolerated treatment well   Behavior [facility] for tasks assessed/performed      Past Medical History  Diagnosis Date  . Hyperlipidemia   . Thrombocytopenia (HCC)   . Vitamin D deficiency disease   . Alkaline phosphatase elevation     Past Surgical History  Procedure Laterality Date  . Tubal ligation  2002  . Breast surgery  2003    tissue removed from breast    There were no vitals filed for this visit.  Visit Diagnosis:  Left-sided low back pain with left-sided sciatica  Abnormal posture  Cervical pain  Stiffness of cervical spine  Muscle weakness      Subjective Assessment - 06/22/15 1331    Subjective No c/o pain at this time.  Pt. states she had a good weekend at the beach with no limitations reported.     Limitations Sitting;Standing;Walking   Diagnostic tests Pt. 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    Patient Stated Goals Decrease pain with all tasks   Currently in Pain? No/denies      OBJECTIVE: Manual tx.: Seated STM to B UT region in with cervical AROM all planes (no limitations or pain reported). Supine LE/lumbar stretches 6 min. (great flexibility/ no back limitations). There.ex.: Seated ball ex. (pelvic tilts/ marching/ LAQ with core activation and no UE assist).  Seated ball 3# press-ups/ bicep curls/sh. Abd. 20x. Reviewed HEP (GTB scap. Retraction/ diagonals/ cervical retraction into  pillow/ cervical isometric lateral flexion)- good technique and cuing to maintain proper head position.  35# floor to waist lifting with proper body mechanics.  20# overhead box placement. 100# sled push/pull 4x (no pain but difficulty).  30# box carrying in clinic.    Pt response for medical necessity: Progressing well with core stability and cervical AROM all planes. No increase c/o pain t/o tx. PT discussed writing a work note next tx.       PT Long Term Goals - 06/08/15 1454    PT LONG TERM GOAL #1   Title Pt will increase B hip flexion MMT strength to >4+/5 in order to safely ascend/descend stairs.   Baseline B hip flex: 4/5 on 2/20   Time 4   Period Weeks   Status New   PT LONG TERM GOAL #2   Title Pt will decrease MODI score to <10% to improve functional mobility with household tasks.   Baseline MODI: 18% on 2/20   Time 4   Period Weeks   Status New   PT LONG TERM GOAL #3   Title Pt will be able to sit for >30 mins with no reports of increased pain in order to complete occupational tasks.   Time 4   Period Weeks   Status New   PT LONG TERM GOAL #4   Title Pt will decrease NDI score to <10% to decrease self-percieved disability.  Baseline NDI: 20% on 2/20   Time 4   Period Weeks   Status New            Plan - 06/22/15 1333    Clinical Impression Statement Great cervical/lumbar/LE AROM (all planes).  Progressing well with core stability/ upright posture on ball with LE/UE involvement.  No palpable tenderness during UT/levator reassessment.  Good body mechanics with floor to waist lifting/ B carrying/ pushing and pulling tasks.    Pt will benefit from skilled therapeutic intervention in order to improve on the following deficits Decreased activity tolerance;Decreased endurance;Decreased mobility;Decreased strength;Hypomobility;Postural dysfunction;Improper body mechanics;Pain;Increased fascial restricitons;Decreased range of motion   Rehab Potential Good    PT Frequency 2x / week   PT Duration 4 weeks   PT Treatment/Interventions ADLs/Self Care Home Management;Cryotherapy;Electrical Stimulation;Moist Heat;Ultrasound;Gait training;Stair training;Functional mobility training;Therapeutic activities;Therapeutic exercise;Balance training;Patient/family education;Manual techniques   PT Next Visit Plan progress core activation program.  Issue work note.  Pt. planning on bringing in paperwork from work to complete for functional status.     PT Home Exercise Plan see above handout   Consulted and Agree with Plan of Care Patient        Problem List Patient Active Problem List   Diagnosis Date Noted  . Patient left without being seen 05/01/2015  . Lumbar back pain with radiculopathy affecting left lower extremity 04/18/2015  . Hyperlipidemia   . Thrombocytopenia (HCC)   . Vitamin D deficiency disease   . Alkaline phosphatase elevation    Cammie Mcgee, PT, DPT # [id number]   06/22/2015, 1:36 PM  [place] [address] [place], [place], [address] Phone: [phone]   Fax:  [phone]  Name: Yolanda Tran MRN: [id number] Date of Birth: 1970-06-14

## 2015-06-25 ENCOUNTER — Ambulatory Visit: Payer: BLUE CROSS/BLUE SHIELD | Admitting: Physical Therapy

## 2015-06-25 DIAGNOSIS — R293 Abnormal posture: Secondary | ICD-10-CM

## 2015-06-25 DIAGNOSIS — M6281 Muscle weakness (generalized): Secondary | ICD-10-CM

## 2015-06-25 DIAGNOSIS — M542 Cervicalgia: Secondary | ICD-10-CM

## 2015-06-25 DIAGNOSIS — M5442 Lumbago with sciatica, left side: Secondary | ICD-10-CM

## 2015-06-25 DIAGNOSIS — M436 Torticollis: Secondary | ICD-10-CM

## 2015-06-25 NOTE — Therapy (Signed)
[facility] [address] [place], [place], [address] Phone: [phone]   Fax:  [phone]  Physical Therapy Treatment  Patient Details  Name: Yolanda Tran MRN: [id number] Date of Birth: 1970-12-27 Referring Provider: Dr. Sherie Don  Encounter Date: 06/25/2015      PT End of Session - 06/25/15 1311    Visit Number 6   Number of Visits 8   Date for PT Re-Evaluation 07/06/15   PT Start Time 1309   PT Stop Time 1400   PT Time Calculation (min) 51 min   Activity Tolerance Patient tolerated treatment well   Behavior During Therapy [facility] for tasks assessed/performed      Past Medical History  Diagnosis Date  . Hyperlipidemia   . Thrombocytopenia (HCC)   . Vitamin D deficiency disease   . Alkaline phosphatase elevation     Past Surgical History  Procedure Laterality Date  . Tubal ligation  2002  . Breast surgery  2003    tissue removed from breast    There were no vitals filed for this visit.  Visit Diagnosis:  Left-sided low back pain with left-sided sciatica  Abnormal posture  Cervical pain  Stiffness of cervical spine  Muscle weakness      Subjective Assessment - 06/25/15 1309    Subjective Pt. reports no pain at this time but states when she drove to [facility] with cooler weather she felt spasming in L upper leg.     Limitations Sitting;Standing;Walking   Diagnostic tests Pt. 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    Patient Stated Goals Decrease pain with all tasks   Currently in Pain? No/denies        OBJECTIVE: Scifit L6 10 min. B UE/LE.  Manual tx.: Seated STM to B UT region in with cervical AROM all planes (no limitations or pain reported). Supine traction/ cervical ROM reassessment (no limitations). There.ex.: Seated ball ex. (walk outs). See new HEP (quadruped/ bicycles/ prone plank on knees).    Pt response for medical necessity: Progressing well with core stability  and cervical AROM all planes. No increase c/o pain t/o tx. Sent work note to MD.         PT Long Term Goals - 06/08/15 1454    PT LONG TERM GOAL #1   Title Pt will increase B hip flexion MMT strength to >4+/5 in order to safely ascend/descend stairs.   Baseline B hip flex: 4/5 on 2/20   Time 4   Period Weeks   Status New   PT LONG TERM GOAL #2   Title Pt will decrease MODI score to <10% to improve functional mobility with household tasks.   Baseline MODI: 18% on 2/20   Time 4   Period Weeks   Status New   PT LONG TERM GOAL #3   Title Pt will be able to sit for >30 mins with no reports of increased pain in order to complete occupational tasks.   Time 4   Period Weeks   Status New   PT LONG TERM GOAL #4   Title Pt will decrease NDI score to <10% to decrease self-percieved disability.   Baseline NDI: 20% on 2/20   Time 4   Period Weeks   Status New            Plan - 06/26/15 0715    Clinical Impression Statement Pt. has progressed to higher level core stability ex. program with proper TrA  muscle contraction.  No limitations in cervical ROM or LE flexibility at this time.  Primary focus is progression to core stability ex./ gym based ex. program.     Pt will benefit from skilled therapeutic intervention in order to improve on the following deficits Decreased activity tolerance;Decreased endurance;Decreased mobility;Decreased strength;Hypomobility;Postural dysfunction;Improper body mechanics;Pain;Increased fascial restricitons;Decreased range of motion   Rehab Potential Good   PT Frequency 2x / week   PT Duration 4 weeks   PT Treatment/Interventions ADLs/Self Care Home Management;Cryotherapy;Electrical Stimulation;Moist Heat;Ultrasound;Gait training;Stair training;Functional mobility training;Therapeutic activities;Therapeutic exercise;Balance training;Patient/family education;Manual techniques   PT Next Visit Plan progress core activation program.  Send work noted to MD.           Problem List Patient Active Problem List   Diagnosis Date Noted  . Patient left without being seen 05/01/2015  . Lumbar back pain with radiculopathy affecting left lower extremity 04/18/2015  . Hyperlipidemia   . Thrombocytopenia (HCC)   . Vitamin D deficiency disease   . Alkaline phosphatase elevation    Cammie McgeeMichael C Valetta Mulroy, PT, DPT # [id number]   06/26/2015, 7:20 AM  [place] [facility] [address] [place], [place], [address] Phone: [phone]   Fax:  [phone]  Name: Yolanda Tran MRN: [id number] Date of Birth: 09-Jan-1971

## 2015-06-29 ENCOUNTER — Ambulatory Visit: Payer: BLUE CROSS/BLUE SHIELD | Admitting: Physical Therapy

## 2015-06-29 ENCOUNTER — Encounter: Payer: Self-pay | Admitting: Physical Therapy

## 2015-06-29 DIAGNOSIS — R293 Abnormal posture: Secondary | ICD-10-CM

## 2015-06-29 DIAGNOSIS — M542 Cervicalgia: Secondary | ICD-10-CM

## 2015-06-29 DIAGNOSIS — M6281 Muscle weakness (generalized): Secondary | ICD-10-CM

## 2015-06-29 DIAGNOSIS — M436 Torticollis: Secondary | ICD-10-CM

## 2015-06-29 DIAGNOSIS — M5442 Lumbago with sciatica, left side: Secondary | ICD-10-CM

## 2015-06-29 NOTE — Therapy (Signed)
[place] [facility] [address] [place], [place], [address] Phone: [phone]   Fax:  [phone]  Physical Therapy Treatment  Patient Details  Name: Yolanda Tran MRN: [id number] Date of Birth: Feb 08, 1971 Referring Provider: Dr. Sanda Klein  Encounter Date: 06/29/2015      PT End of Session - 06/29/15 1329    Visit Number 7   Number of Visits 8   Date for PT Re-Evaluation 07/06/15   PT Start Time 1306   PT Stop Time 1359   PT Time Calculation (min) 53 min   Activity Tolerance Patient tolerated treatment well   Behavior During Therapy [facility] for tasks assessed/performed      Past Medical History  Diagnosis Date  . Hyperlipidemia   . Thrombocytopenia ([place])   . Vitamin D deficiency disease   . Alkaline phosphatase elevation     Past Surgical History  Procedure Laterality Date  . Tubal ligation  2002  . Breast surgery  2003    tissue removed from breast    There were no vitals filed for this visit.  Visit Diagnosis:  Left-sided low back pain with left-sided sciatica  Abnormal posture  Cervical pain  Stiffness of cervical spine  Muscle weakness      Subjective Assessment - 06/29/15 1320    Subjective Pt. states she is doing well but cold weather has been tough on her body this past weekend.  No new complaints.  Pt. states she did some of the exercises this weekend with no issues reported.     Limitations Sitting;Standing;Walking   Diagnostic tests Pt. 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    Patient Stated Goals Decrease pain with all tasks   Currently in Pain? No/denies        OBJECTIVE: Scifit L6 10 min. B UE/LE. There.ex.: Reviewed TrA ex. packet.  Quadruped alt. UE/LE, Cat-camel, prone press-ups 10x2 each on blue mat table.  Standing lumbar flexion/ext./rotn. (no limitations or pain reported).  Standing scap. Retraction/ posture correction.         Pt response for medical  necessity: Progressing well with core stability and cervical AROM all planes. No increase c/o pain t/o tx. Pt. Has no work limitations noted at this time.          PT Long Term Goals - 06/29/15 1343    PT LONG TERM GOAL #1   Title Pt will increase B hip flexion MMT strength to >4+/5 in order to safely ascend/descend stairs.   Time 4   Period Weeks   Status Achieved   PT LONG TERM GOAL #2   Title Pt will decrease MODI score to <10% to improve functional mobility with household tasks.   Baseline 16% self-perceived minimal disability   Time 4   Period Weeks   Status Partially Met   PT LONG TERM GOAL #3   Title Pt will be able to sit for >30 mins with no reports of increased pain in order to complete occupational tasks.   Time 4   Period Weeks   Status Partially Met   PT LONG TERM GOAL #4   Title Pt will decrease NDI score to <10% to decrease self-percieved disability.   Baseline NDI: 10% on 3/13   Time 4   Status Achieved            Plan - 06/29/15 1930    Clinical Impression Statement Pt. continues to progress with higher level core stability ex.  in quadruped/prone posture with no c/o neck or back pain.  Good mobility in thoracic/lumbar spine during flexion and ext. tasks.  Probable discharge next visit with HEP focus.  Pt. has demonstrated ability to lift 20# box from floor to waist, overhead and carry without limitations and proper technique.  No work limitations at this time.          Pt will benefit from skilled therapeutic intervention in order to improve on the following deficits Decreased activity tolerance;Decreased endurance;Decreased mobility;Decreased strength;Hypomobility;Postural dysfunction;Improper body mechanics;Pain;Increased fascial restricitons;Decreased range of motion   Rehab Potential Good   PT Frequency 2x / week   PT Duration 4 weeks   PT Treatment/Interventions ADLs/Self Care Home Management;Cryotherapy;Electrical Stimulation;Moist  Heat;Ultrasound;Gait training;Stair training;Functional mobility training;Therapeutic activities;Therapeutic exercise;Balance training;Patient/family education;Manual techniques   PT Next Visit Plan Probable discharge next visit.  Recheck goals.     PT Home Exercise Plan see above handout   Consulted and Agree with Plan of Care Patient        Problem List Patient Active Problem List   Diagnosis Date Noted  . Patient left without being seen 05/01/2015  . Lumbar back pain with radiculopathy affecting left lower extremity 04/18/2015  . Hyperlipidemia   . Thrombocytopenia ([place])   . Vitamin D deficiency disease   . Alkaline phosphatase elevation    Pura Spice, PT, DPT # [id number]   06/30/2015, 8:08 PM  [place] [facility] [address] [place], [place], [address] Phone: [phone]   Fax:  [phone]  Name: Yolanda Tran MRN: [id number] Date of Birth: [birth year]

## 2015-07-01 ENCOUNTER — Encounter: Payer: BLUE CROSS/BLUE SHIELD | Admitting: Physical Therapy

## 2017-05-10 ENCOUNTER — Ambulatory Visit: Payer: Self-pay | Admitting: Obstetrics and Gynecology

## 2017-05-25 ENCOUNTER — Ambulatory Visit: Payer: Self-pay | Admitting: Obstetrics and Gynecology

## 2017-06-06 ENCOUNTER — Ambulatory Visit (INDEPENDENT_AMBULATORY_CARE_PROVIDER_SITE_OTHER): Payer: BLUE CROSS/BLUE SHIELD | Admitting: Obstetrics and Gynecology

## 2017-06-06 ENCOUNTER — Encounter: Payer: Self-pay | Admitting: Obstetrics and Gynecology

## 2017-06-06 VITALS — BP 120/70 | HR 90 | Ht 65.0 in | Wt 146.0 lb

## 2017-06-06 DIAGNOSIS — Z1151 Encounter for screening for human papillomavirus (HPV): Secondary | ICD-10-CM

## 2017-06-06 DIAGNOSIS — Z1231 Encounter for screening mammogram for malignant neoplasm of breast: Secondary | ICD-10-CM

## 2017-06-06 DIAGNOSIS — Z1321 Encounter for screening for nutritional disorder: Secondary | ICD-10-CM | POA: Diagnosis not present

## 2017-06-06 DIAGNOSIS — Z0000 Encounter for general adult medical examination without abnormal findings: Secondary | ICD-10-CM

## 2017-06-06 DIAGNOSIS — Z124 Encounter for screening for malignant neoplasm of cervix: Secondary | ICD-10-CM | POA: Diagnosis not present

## 2017-06-06 DIAGNOSIS — R102 Pelvic and perineal pain: Secondary | ICD-10-CM | POA: Diagnosis not present

## 2017-06-06 DIAGNOSIS — Z01419 Encounter for gynecological examination (general) (routine) without abnormal findings: Secondary | ICD-10-CM

## 2017-06-06 DIAGNOSIS — Z13220 Encounter for screening for lipoid disorders: Secondary | ICD-10-CM

## 2017-06-06 DIAGNOSIS — Z1239 Encounter for other screening for malignant neoplasm of breast: Secondary | ICD-10-CM

## 2017-06-06 NOTE — Patient Instructions (Addendum)
I value your feedback and entrusting us with your care. If you get a [facility] patient survey, I would appreciate you taking the time to let us know about your experience today. Thank you!  [facility]: [phone]  [facility]: [phone]  

## 2017-06-06 NOTE — Progress Notes (Signed)
PCP:  Kerman PasseyLada, Melinda P, MD   Chief Complaint  Patient presents with  . Gynecologic Exam     HPI:      Ms. Yolanda Tran is a 47 y.o. [id number] who LMP was Patient's last menstrual period was 05/21/2017., presents today for her NP annual examination.  Her menses are regular every 28-30 days, lasting 5 days.  Dysmenorrhea mild, occurring first 1-2 days of flow. She does not have intermenstrual bleeding. Notes occas sharp pains in vagina. No pain with sex. No triggers. Sx happen randomly every few months. No pelvic pain, vag d/c.  Sex activity: single partner, contraception - tubal ligation.  Last Pap: March 18, 2013  Results were: no abnormalities /neg HPV DNA  Hx of STDs: none  Last mammogram: a few yrs ago  There is a FH of breast cancer in her mat aunt and mat cousin, genetic testing not indicated. There is no FH of ovarian cancer. The patient does do self-breast exams.  Tobacco use: The patient denies current or previous tobacco use. Alcohol use: none No drug use.  Exercise: moderately active  She does not get adequate calcium and Vitamin D in her diet.  No recent labs. Hasn't seen PCP in a few yrs.  Past Medical History:  Diagnosis Date  . Alkaline phosphatase elevation   . Hyperlipidemia   . Ovarian cyst   . Thrombocytopenia (HCC)   . Vitamin D deficiency disease     Past Surgical History:  Procedure Laterality Date  . BREAST SURGERY  2003   tissue removed from breast  . TUBAL LIGATION  2002    Family History  Problem Relation Age of Onset  . Stroke Mother   . Hypertension Mother   . Heart disease Mother   . Diabetes Father   . Heart disease Father   . Congestive Heart Failure Father   . Cancer Father        bone and prostate  . Diabetes Paternal Grandmother   . Breast cancer Maternal Aunt 60  . Breast cancer Cousin   . Colon cancer Maternal Aunt 60  . COPD Neg Hx     Social History   Socioeconomic History  . Marital status: Married   Spouse name: Not on file  . Number of children: Not on file  . Years of education: Not on file  . Highest education level: Not on file  Social Needs  . Financial resource strain: Not on file  . Food insecurity - worry: Not on file  . Food insecurity - inability: Not on file  . Transportation needs - medical: Not on file  . Transportation needs - non-medical: Not on file  Occupational History  . Not on file  Tobacco Use  . Smoking status: Never Smoker  . Smokeless tobacco: Never Used  Substance and Sexual Activity  . Alcohol use: No    Comment: occasional  . Drug use: No  . Sexual activity: Yes    Birth control/protection: None, Surgical  Other Topics Concern  . Not on file  Social History Narrative  . Not on file    No outpatient medications have been marked as taking for the 06/06/17 encounter (Office Visit) with Deshondra Worst, Ilona SorrelAlicia B, PA-C.     ROS:  Review of Systems  Constitutional: Negative for fatigue, fever and unexpected weight change.  Respiratory: Negative for cough, shortness of breath and wheezing.   Cardiovascular: Negative for chest pain, palpitations and leg swelling.  Gastrointestinal: Negative for blood  in stool, constipation, diarrhea, nausea and vomiting.  Endocrine: Negative for cold intolerance, heat intolerance and polyuria.  Genitourinary: Negative for dyspareunia, dysuria, flank pain, frequency, genital sores, hematuria, menstrual problem, pelvic pain, urgency, vaginal bleeding, vaginal discharge and vaginal pain.  Musculoskeletal: Negative for back pain, joint swelling and myalgias.  Skin: Negative for rash.  Neurological: Negative for dizziness, syncope, light-headedness, numbness and headaches.  Hematological: Negative for adenopathy.  Psychiatric/Behavioral: Negative for agitation, confusion, sleep disturbance and suicidal ideas. The patient is not nervous/anxious.      Objective: BP 120/70   Pulse 90   Ht 5\' 5"  (1.651 m)   Wt 146 lb (66.2  kg)   LMP 05/21/2017   BMI 24.30 kg/m    Physical Exam  Constitutional: She is oriented to person, place, and time. She appears well-developed and well-nourished.  Genitourinary: Vagina normal and uterus normal. There is no rash or tenderness on the right labia. There is no rash or tenderness on the left labia. No erythema or tenderness in the vagina. No vaginal discharge found. Right adnexum does not display mass and does not display tenderness. Left adnexum does not display mass and does not display tenderness. Cervix does not exhibit motion tenderness or polyp. Uterus is not enlarged or tender.  Neck: Normal range of motion. No thyromegaly present.  Cardiovascular: Normal rate, regular rhythm and normal heart sounds.  No murmur heard. Pulmonary/Chest: Effort normal and breath sounds normal. Right breast exhibits no mass, no nipple discharge, no skin change and no tenderness. Left breast exhibits no mass, no nipple discharge, no skin change and no tenderness.  Abdominal: Soft. There is no tenderness. There is no guarding.  Musculoskeletal: Normal range of motion.  Neurological: She is alert and oriented to person, place, and time. No cranial nerve deficit.  Psychiatric: She has a normal mood and affect. Her behavior is normal.  Vitals reviewed.   Assessment/Plan: Encounter for annual routine gynecological examination  Cervical cancer screening - Plan: IGP, Aptima HPV  Screening for HPV (human papillomavirus) - Plan: IGP, Aptima HPV  Screening for breast cancer - Pt to sched mammo. - Plan: MM DIGITAL SCREENING BILATERAL  Blood tests for routine general physical examination - Plan: Comprehensive metabolic panel, Lipid panel, VITAMIN D 25 Hydroxy (Vit-D Deficiency, Fractures)  Screening cholesterol level - Plan: Lipid panel  Encounter for vitamin deficiency screening - Plan: VITAMIN D 25 Hydroxy (Vit-D Deficiency, Fractures)  Vaginal pain - Question spasm. See if related to menses.  Sx infrequent. F/u prn.           GYN counsel breast self exam, mammography screening, adequate intake of calcium and vitamin D, diet and exercise     F/U  Return in about 1 year (around 06/06/2018).  Audree Schrecengost B. Fancy Dunkley, PA-C 06/06/2017 11:27 AM

## 2017-06-07 LAB — COMPREHENSIVE METABOLIC PANEL
ALT: 50 IU/L — ABNORMAL HIGH (ref 0–32)
AST: 48 IU/L — ABNORMAL HIGH (ref 0–40)
Albumin/Globulin Ratio: 1.5 (ref 1.2–2.2)
Albumin: 4.2 g/dL (ref 3.5–5.5)
Alkaline Phosphatase: 158 IU/L — ABNORMAL HIGH (ref 39–117)
BUN/Creatinine Ratio: 10 (ref 9–23)
BUN: 7 mg/dL (ref 6–24)
Bilirubin Total: 0.4 mg/dL (ref 0.0–1.2)
CO2: 22 mmol/L (ref 20–29)
Calcium: 8.9 mg/dL (ref 8.7–10.2)
Chloride: 104 mmol/L (ref 96–106)
Creatinine, Ser: 0.7 mg/dL (ref 0.57–1.00)
GFR calc Af Amer: 118 mL/min/{1.73_m2} (ref >59)
GFR calc non Af Amer: 103 mL/min/{1.73_m2} (ref >59)
Globulin, Total: 2.8 g/dL (ref 1.5–4.5)
Glucose: 72 mg/dL (ref 65–99)
Potassium: 4 mmol/L (ref 3.5–5.2)
Sodium: 142 mmol/L (ref 134–144)
Total Protein: 7 g/dL (ref 6.0–8.5)

## 2017-06-07 LAB — LIPID PANEL
Chol/HDL Ratio: 3.8 ratio (ref 0.0–4.4)
Cholesterol, Total: 226 mg/dL — ABNORMAL HIGH (ref 100–199)
HDL: 60 mg/dL (ref >39)
LDL Calculated: 146 mg/dL — ABNORMAL HIGH (ref 0–99)
Triglycerides: 98 mg/dL (ref 0–149)
VLDL Cholesterol Cal: 20 mg/dL (ref 5–40)

## 2017-06-07 LAB — VITAMIN D 25 HYDROXY (VIT D DEFICIENCY, FRACTURES): Vit D, 25-Hydroxy: 30.9 ng/mL (ref 30.0–100.0)

## 2017-06-08 LAB — IGP, APTIMA HPV
HPV Aptima: NEGATIVE
PAP Smear Comment: 0

## 2017-06-09 ENCOUNTER — Telehealth: Payer: Self-pay | Admitting: Obstetrics and Gynecology

## 2017-06-09 NOTE — Telephone Encounter (Signed)
Patient is calling about her lab results °Please advise ° °

## 2017-06-12 NOTE — Telephone Encounter (Signed)
LMTRC

## 2017-06-12 NOTE — Telephone Encounter (Signed)
Done

## 2017-07-21 ENCOUNTER — Ambulatory Visit
Admission: RE | Admit: 2017-07-21 | Discharge: 2017-07-21 | Disposition: A | Discharge status: Home / Self Care | Payer: BLUE CROSS/BLUE SHIELD | Source: Ambulatory Visit | Attending: Obstetrics and Gynecology | Admitting: Obstetrics and Gynecology

## 2017-07-21 DIAGNOSIS — Z1231 Encounter for screening mammogram for malignant neoplasm of breast: Secondary | ICD-10-CM | POA: Insufficient documentation

## 2017-07-21 DIAGNOSIS — Z1239 Encounter for other screening for malignant neoplasm of breast: Secondary | ICD-10-CM

## 2017-07-24 ENCOUNTER — Encounter: Payer: Self-pay | Admitting: Obstetrics and Gynecology

## 2018-01-03 ENCOUNTER — Other Ambulatory Visit: Payer: Self-pay

## 2018-01-03 ENCOUNTER — Encounter: Payer: Self-pay | Admitting: Nurse Practitioner

## 2018-01-03 ENCOUNTER — Ambulatory Visit (INDEPENDENT_AMBULATORY_CARE_PROVIDER_SITE_OTHER): Payer: BC Managed Care – PPO | Admitting: Nurse Practitioner

## 2018-01-03 VITALS — BP 117/71 | HR 81 | Temp 98.4°F | Ht 65.0 in | Wt 139.6 lb

## 2018-01-03 DIAGNOSIS — Z7689 Persons encountering health services in other specified circumstances: Secondary | ICD-10-CM | POA: Diagnosis not present

## 2018-01-03 DIAGNOSIS — Z7189 Other specified counseling: Secondary | ICD-10-CM

## 2018-01-03 DIAGNOSIS — Z111 Encounter for screening for respiratory tuberculosis: Secondary | ICD-10-CM

## 2018-01-03 DIAGNOSIS — Z0289 Encounter for other administrative examinations: Secondary | ICD-10-CM

## 2018-01-03 DIAGNOSIS — E782 Mixed hyperlipidemia: Secondary | ICD-10-CM

## 2018-01-03 DIAGNOSIS — R748 Abnormal levels of other serum enzymes: Secondary | ICD-10-CM

## 2018-01-03 DIAGNOSIS — E559 Vitamin D deficiency, unspecified: Secondary | ICD-10-CM

## 2018-01-03 DIAGNOSIS — Z7185 Encounter for immunization safety counseling: Secondary | ICD-10-CM

## 2018-01-03 DIAGNOSIS — D696 Thrombocytopenia, unspecified: Secondary | ICD-10-CM

## 2018-01-03 NOTE — Progress Notes (Signed)
Subjective:    Patient ID: Yolanda Tran, female    DOB: 1970/12/26, 47 y.o.   MRN: [id number]  Yolanda Tran is a 47 y.o. female presenting on 01/03/2018 for [place] (employee physical)   HPI [facility] Provider Pt last seen by PCP Dr. Sanda Klein about 3-4 years ago.  Obtain records from [facility].    Employment Physical Patient has started new job as Buyer, retail across 3 classrooms.  Feels great reward in her job, although is occasionally stressful.  Generally has stable moods and no difficulty with becoming easily agitated, irritable, or angry.  Patient has been able to carry out all regular work functions and has no difficulties with lifting 35 lbs.  Past Medical History:  Diagnosis Date  . Alkaline phosphatase elevation   . Allergy   . Hyperlipidemia   . Ovarian cyst   . Thrombocytopenia ([place])   . Vitamin D deficiency disease    Past Surgical History:  Procedure Laterality Date  . BREAST SURGERY  2003   tissue removed from breast  . TUBAL LIGATION  2002   Social History   Socioeconomic History  . Marital status: Married    Spouse name: Not on file  . Number of children: 2  . Years of education: Not on file  . Highest education level: Bachelor's degree (e.g., BA, AB, BS)  Occupational History  . Not on file  Social Needs  . Financial resource strain: Not very hard  . Food insecurity:    Worry: Never true    Inability: Never true  . Transportation needs:    Medical: No    Non-medical: No  Tobacco Use  . Smoking status: Never Smoker  . Smokeless tobacco: Never Used  Substance and Sexual Activity  . Alcohol use: Yes    Comment: 2-3 monthly   . Drug use: No  . Sexual activity: Yes    Birth control/protection: None, Surgical  Lifestyle  . Physical activity:    Days per week: 1 day    Minutes per session: 30 min  . Stress: Not at all  Relationships  . Social connections:    Talks on phone: Not on file    Gets  together: Not on file    Attends religious service: More than 4 times per year    Active member of club or organization: Yes    Attends meetings of clubs or organizations: More than 4 times per year    Relationship status: Married  . Intimate partner violence:    Fear of current or ex partner: No    Emotionally abused: No    Physically abused: No    Forced sexual activity: No  Other Topics Concern  . Not on file  Social History Narrative  . Not on file   Family History  Problem Relation Age of Onset  . Stroke Mother   . Hypertension Mother   . Heart disease Mother   . Depression Mother   . Diabetes Father   . Heart disease Father   . Congestive Heart Failure Father   . Cancer Father        bone  . Diabetes Paternal Grandmother   . Breast cancer Maternal Aunt 60  . Breast cancer Cousin   . Colon cancer Maternal Aunt 11  . Healthy Daughter   . Healthy Son   . Stroke Maternal Grandmother   . Heart disease Maternal Grandfather   . COPD Neg Hx    Current  Outpatient Medications on File Prior to Visit  Medication Sig  . Cholecalciferol (VITAMIN D-3 PO) Take by mouth daily.  . Cyanocobalamin (VITAMIN B-12 PO) Take by mouth daily.  Marland Kitchen FERROUS SULFATE PO Take by mouth daily.   No current facility-administered medications on file prior to visit.     Review of Systems  Constitutional: Negative for chills and fever.  HENT: Negative for congestion and sore throat.   Eyes: Negative for pain.  Respiratory: Negative for cough, shortness of breath and wheezing.   Cardiovascular: Negative for chest pain, palpitations and leg swelling.  Gastrointestinal: Negative for abdominal pain, blood in stool, constipation, diarrhea, nausea and vomiting.  Endocrine: Negative for polydipsia.  Genitourinary: Negative for dysuria, frequency, hematuria and urgency.  Musculoskeletal: Positive for arthralgias. Negative for back pain, myalgias and neck pain.  Skin: Negative.  Negative for rash.    Allergic/Immunologic: Negative for environmental allergies.  Neurological: Negative for dizziness, weakness and headaches.  Hematological: Does not bruise/bleed easily.  Psychiatric/Behavioral: Negative for dysphoric mood and suicidal ideas. The patient is not nervous/anxious.    Per HPI unless specifically indicated above     Objective:    BP 117/71 (BP Location: Right Arm, Patient Position: Sitting, Cuff Size: Normal)   Pulse 81   Temp 98.4 F (36.9 C) (Oral)   Ht 5' 5"  (1.651 m)   Wt 139 lb 9.6 oz (63.3 kg)   LMP 12/26/2017   BMI 23.23 kg/m     Hearing Screening   125Hz  250Hz  500Hz  1000Hz  2000Hz  3000Hz  4000Hz  6000Hz  8000Hz   Right ear:   Pass Pass Pass  Pass    Left ear:   Pass Pass Pass  Pass      Visual Acuity Screening   Right eye Left eye Both eyes  Without correction:     With correction: 20/20 20/20 20/20     Wt Readings from Last 3 Encounters:  01/03/18 139 lb 9.6 oz (63.3 kg)  06/06/17 146 lb (66.2 kg)  06/01/15 131 lb 9.6 oz (59.7 kg)    Physical Exam  Constitutional: She is oriented to person, place, and time. She appears well-developed and well-nourished. No distress.  HENT:  Head: Normocephalic and atraumatic.  Cardiovascular: Normal rate, regular rhythm, S1 normal, S2 normal, normal heart sounds and intact distal pulses.  Pulmonary/Chest: Effort normal and breath sounds normal. No respiratory distress.  Abdominal: Soft. Bowel sounds are normal. She exhibits no distension. There is no hepatosplenomegaly. There is no tenderness. No hernia.  Musculoskeletal: Normal range of motion.  Neurological: She is alert and oriented to person, place, and time. She has normal strength and normal reflexes. No cranial nerve deficit or sensory deficit. Coordination and gait normal. GCS eye subscore is 4. GCS verbal subscore is 5. GCS motor subscore is 6.  Skin: Skin is warm and dry. Capillary refill takes less than 2 seconds.  Psychiatric: She has a normal mood and affect.  Her behavior is normal. Judgment and thought content normal.  Vitals reviewed.   Results for orders placed or performed in visit on 06/06/17  Comprehensive metabolic panel  Result Value Ref Range   Glucose 72 65 - 99 mg/dL   BUN 7 6 - 24 mg/dL   Creatinine, Ser 0.70 0.57 - 1.00 mg/dL   GFR calc non Af Amer 103 >59 mL/min/1.73   GFR calc Af Amer 118 >59 mL/min/1.73   BUN/Creatinine Ratio 10 9 - 23   Sodium 142 134 - 144 mmol/L   Potassium 4.0 3.5 - 5.2  mmol/L   Chloride 104 96 - 106 mmol/L   CO2 22 20 - 29 mmol/L   Calcium 8.9 8.7 - 10.2 mg/dL   Total Protein 7.0 6.0 - 8.5 g/dL   Albumin 4.2 3.5 - 5.5 g/dL   Globulin, Total 2.8 1.5 - 4.5 g/dL   Albumin/Globulin Ratio 1.5 1.2 - 2.2   Bilirubin Total 0.4 0.0 - 1.2 mg/dL   Alkaline Phosphatase 158 (H) 39 - 117 IU/L   AST 48 (H) 0 - 40 IU/L   ALT 50 (H) 0 - 32 IU/L  Lipid panel  Result Value Ref Range   Cholesterol, Total 226 (H) 100 - 199 mg/dL   Triglycerides 98 0 - 149 mg/dL   HDL 60 >39 mg/dL   VLDL Cholesterol Cal 20 5 - 40 mg/dL   LDL Calculated 146 (H) 0 - 99 mg/dL   Chol/HDL Ratio 3.8 0.0 - 4.4 ratio  VITAMIN D 25 Hydroxy (Vit-D Deficiency, Fractures)  Result Value Ref Range   Vit D, 25-Hydroxy 30.9 30.0 - 100.0 ng/mL  IGP, Aptima HPV  Result Value Ref Range   DIAGNOSIS: Comment    Specimen adequacy: Comment    Clinician Provided ICD10 Comment    Performed by: Comment    PAP Smear Comment .    Note: Comment    Test Methodology Comment    HPV Aptima Negative Negative      Assessment & Plan:   Problem List Items Addressed This Visit      Other   Hyperlipidemia Status unknown.  Recheck labs.  Followup in 3 months, about 2-3 days after labs.    Relevant Orders   Lipid panel   Comprehensive metabolic panel   Thrombocytopenia (HCC) Status unknown.  Recheck labs.  Followup in 3 months, about 2-3 days after labs.    Relevant Orders   CBC with Differential/Platelet   Vitamin D deficiency disease Status  unknown.  Recheck labs.  Followup in 3 months, about 2-3 days after labs.    Relevant Orders   VITAMIN D 25 Hydroxy (Vit-D Deficiency, Fractures)   Alkaline phosphatase elevation Status unknown.  Recheck labs.  Followup in 3 months, about 2-3 days after labs.    Relevant Orders   Comprehensive metabolic panel    Other Visit Diagnoses    Encounter for physical examination related to employment    -  Primary Patient with need to have employment physical for ABSS.  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  Form completed.  Immunizations are still pending. - Titers for Hep B and MMR today.   - PPD placed today for TB screening. Return to clinic Friday afternoon for ppd reading. - Followup prn.    Encounter to establish care     Previous PCP was at Dr. Rance Muir office.  Records are reviewed.  Past medical, family, and surgical history reviewed w/ pt.     Immunization counseling       Relevant Orders   Measles/Mumps/Rubella Immunity   Hepatitis B surface antibody,qualitative   Encounter for PPD test       Relevant Orders   TB Skin Test       Follow up plan: Return in about 3 months (around 04/04/2018) for hyperlipidemia.  Cassell Smiles, DNP, AGPCNP-BC Adult Gerontology Primary Care Nurse Practitioner [facility] 01/03/2018, 10:19 AM

## 2018-01-03 NOTE — Patient Instructions (Addendum)
Yolanda Tran,   Thank you for coming in to clinic today.  1. You are cleared for working as long as TB skin test is negative and your titers show immunity to hepatitis B and MMR.  Please schedule a follow-up appointment with Cassell Smiles, AGNP. Return in about 3 months (around 04/04/2018) for hyperlipidemia.  If you have any other questions or concerns, please feel free to call the clinic or send a message through [facility]. You may also schedule an earlier appointment if necessary.  You will receive a survey after today's visit either digitally by e-mail or paper by [employer]. Your experiences and feedback matter to [place].  Please respond so we know how we are doing as we provide care for you.   Cassell Smiles, DNP, AGNP-BC Adult Gerontology Nurse Practitioner [place] [facility]

## 2018-01-04 LAB — MEASLES/MUMPS/RUBELLA IMMUNITY
Mumps IgG: <9 AU/mL — ABNORMAL LOW
Rubella: 1.31 index
Rubeola IgG: <25 AU/mL — ABNORMAL LOW

## 2018-01-04 LAB — HEPATITIS B SURFACE ANTIBODY,QUALITATIVE: Hep B S Ab: NONREACTIVE

## 2018-01-05 ENCOUNTER — Telehealth: Payer: Self-pay

## 2018-01-05 ENCOUNTER — Encounter: Payer: Self-pay | Admitting: Nurse Practitioner

## 2018-01-05 DIAGNOSIS — Z7189 Other specified counseling: Secondary | ICD-10-CM | POA: Diagnosis not present

## 2018-01-05 DIAGNOSIS — Z23 Encounter for immunization: Secondary | ICD-10-CM | POA: Diagnosis not present

## 2018-01-05 LAB — TB SKIN TEST
Induration: 0 mm
TB Skin Test: NEGATIVE

## 2018-01-05 NOTE — Progress Notes (Signed)
Attempted to contact the patient, no answer. LMOM to return my call. ?

## 2018-01-05 NOTE — Progress Notes (Signed)
Patient has no immunity to MMR or Hep B.  Please administer: Week of 9/23 - one MMR vaccine and one Heplisav B vaccine Week of 10/21 - one Heplisav B vaccine  Patient will only need 2 doses of Hep B vaccine with Heplisav B vaccine, which we should have in stock now.  If not, [facility] please order this.

## 2018-01-05 NOTE — Progress Notes (Signed)
The pt was notified and immunization given at appt today.

## 2018-01-05 NOTE — Addendum Note (Signed)
Addended by: Lonna CobbUSSELL, KELITA L on: 01/05/2018 04:26 PM   Modules accepted: Orders

## 2018-01-08 NOTE — Telephone Encounter (Signed)
Open in error

## 2018-02-05 ENCOUNTER — Ambulatory Visit: Payer: BC Managed Care – PPO

## 2018-02-05 ENCOUNTER — Telehealth: Payer: Self-pay | Admitting: Family Medicine

## 2018-02-05 ENCOUNTER — Ambulatory Visit: Payer: Self-pay

## 2018-02-05 NOTE — Telephone Encounter (Signed)
Patient was seen on 9/18, and had documented immunization visit on 9/20.  Copied from Cassell Smiles, AGPCNP-BC documentation at that last vaccine visit:  Patient has no immunity to MMR or Hep B.  Please administer: Week of 9/23 - one MMR vaccine and one Heplisav B vaccine Week of 10/21 - one Heplisav B vaccine  Patient arrived today now about 1 month later for her final 2nd dose of Hep B.  We only have Energix-Hep B available, which as far as I know is a 3 dose series.  We did not see the Heplisav B vaccine, for the 2 dose series.  She will need to re-schedule for Hep B vaccine - can be given at any point now once we have the 2 dose vaccine in stock.  Will forward this message back to PCP Cassell Smiles, AGPCNP-BC for review.  Also - please make sure Heplisav B vaccine is ordered for this patient - will forward to clinical pool as well. May follow-up on this once Lauren returns.  Nobie Putnam, DO [facility] 02/05/2018, 5:11 PM

## 2018-02-07 ENCOUNTER — Ambulatory Visit (INDEPENDENT_AMBULATORY_CARE_PROVIDER_SITE_OTHER): Payer: BC Managed Care – PPO

## 2018-02-07 DIAGNOSIS — Z23 Encounter for immunization: Secondary | ICD-10-CM

## 2018-02-07 NOTE — Telephone Encounter (Signed)
At first visit for vaccines patient received Engerix B.  This is 3-dose vaccine series.  Please administer 2 additional doses with next dose this week or next at patient's convenience and the third and final dose at 6 months after 2nd dose.

## 2018-02-07 NOTE — Telephone Encounter (Signed)
Attempted to contact the pt. I left a detail message to notify the pt that she can call and schedule an appointment at her conveniences to get her 2nd Hep B vaccine.

## 2018-10-04 ENCOUNTER — Other Ambulatory Visit: Payer: Self-pay | Admitting: Nurse Practitioner

## 2018-10-04 DIAGNOSIS — Z1231 Encounter for screening mammogram for malignant neoplasm of breast: Secondary | ICD-10-CM

## 2018-11-15 ENCOUNTER — Ambulatory Visit
Admission: RE | Admit: 2018-11-15 | Discharge: 2018-11-15 | Disposition: A | Discharge status: Home / Self Care | Payer: BC Managed Care – PPO | Source: Ambulatory Visit | Attending: Nurse Practitioner | Admitting: Nurse Practitioner

## 2018-11-15 ENCOUNTER — Other Ambulatory Visit: Payer: Self-pay

## 2018-11-15 DIAGNOSIS — Z1231 Encounter for screening mammogram for malignant neoplasm of breast: Secondary | ICD-10-CM

## 2019-03-28 ENCOUNTER — Telehealth: Payer: BC Managed Care – PPO

## 2019-03-28 DIAGNOSIS — Z8619 Personal history of other infectious and parasitic diseases: Secondary | ICD-10-CM

## 2019-03-28 HISTORY — DX: Personal history of other infectious and parasitic diseases: Z86.19

## 2019-04-04 ENCOUNTER — Ambulatory Visit: Payer: BLUE CROSS/BLUE SHIELD | Admitting: Obstetrics and Gynecology

## 2019-04-09 ENCOUNTER — Other Ambulatory Visit: Payer: Self-pay

## 2019-04-09 ENCOUNTER — Encounter: Payer: Self-pay | Admitting: Family Medicine

## 2019-04-09 ENCOUNTER — Ambulatory Visit (INDEPENDENT_AMBULATORY_CARE_PROVIDER_SITE_OTHER): Payer: BC Managed Care – PPO | Admitting: Family Medicine

## 2019-04-09 DIAGNOSIS — B0229 Other postherpetic nervous system involvement: Secondary | ICD-10-CM

## 2019-04-09 MED ORDER — GABAPENTIN 100 MG PO CAPS: ORAL_CAPSULE | ORAL | 1 refills | Status: DC

## 2019-04-09 NOTE — Progress Notes (Signed)
Virtual Visit via Telephone The purpose of this virtual visit is to provide medical care while limiting exposure to the novel coronavirus (COVID19) for both patient and office staff.  Consent was obtained for phone visit:  Yes.   Answered questions that patient had about telehealth interaction:  Yes.   I discussed the limitations, risks, security and privacy concerns of performing an evaluation and management service by telephone. I also discussed with the patient that there may be a patient responsible charge related to this service. The patient expressed understanding and agreed to proceed.  Patient Location: Home Provider Location: [facility])  ---------------------------------------------------------------------- Chief Complaint  Patient presents with  . Herpes Zoster    diagnose w/ shingles x 11 days. Outbreak on the Right side of her face above her eye. Pt notice improvement since treatment. The rash has improved, but still intermittent headache that feel like a shoke sensation when it comes on.    S: Reviewed CMA documentation. I have called patient and gathered additional HPI as follows:  Urgent Care FOLLOW-UP VISIT  Hospital/Location: [facility] Date of UC Visit: 03/28/19  Reason for Presenting to UC: Shingles  - UC provider note and record have been reviewed - Patient presents today about 12 days after recent UCvisit. Brief summary of recent course, patient had symptoms of shingles attack with rash on forehead face and headache nerve pain, treated with valtrex for 7 days, and refer to eye doctor [facility] [facility] but she did not have actual eye involvement at that time. She saw ophthalmologist and they gave her reassurance, will re-check on 12/31  - Today reports overall has done well after recent UC visit and treatment for shingles localized to R face above eyebrow and some in hairline. Symptoms of rash has mostly resolved, still slightly itchy  and crusting but it is healing. No further oozing or drainage. No redness. Now main complaint is electric shock sharper pain and headache episodic usually last few seconds to minute, seems to occur every few hours. Initially was more frequent or severe, now seems to be improving.  Denies any high risk travel to areas of current concern for COVID19. Denies any known or suspected exposure to person with or possibly with COVID19.  Denies any fevers, chills, sweats, body ache, cough, shortness of breath, sinus pain or pressure, abdominal pain, diarrhea  Past Medical History:  Diagnosis Date  . Alkaline phosphatase elevation   . Allergy   . Hyperlipidemia   . Ovarian cyst   . Thrombocytopenia ([place])   . Vitamin D deficiency disease    Social History   Tobacco Use  . Smoking status: Never Smoker  . Smokeless tobacco: Never Used  Substance Use Topics  . Alcohol use: Yes    Comment: 2-3 monthly   . Drug use: No    Current Outpatient Medications:  .  cetirizine (ZYRTEC) 10 MG tablet, Take 10 mg by mouth daily., Disp: , Rfl:  .  Cholecalciferol (VITAMIN D-3 PO), Take by mouth daily., Disp: , Rfl:  .  Cyanocobalamin (VITAMIN B-12 PO), Take by mouth daily., Disp: , Rfl:  .  TYLENOL 500 MG tablet, , Disp: , Rfl:  .  FERROUS SULFATE PO, Take by mouth daily., Disp: , Rfl:  .  gabapentin (NEURONTIN) 100 MG capsule, Start 1 capsule daily, increase by 1 cap every 2-3 days as tolerated up to 3 times a day, or may take 3 at once in evening., Disp: 90 capsule, Rfl: 1  Depression screen [facility] 2/9 04/09/2019 01/03/2018  Decreased Interest 0 0  Down, Depressed, Hopeless 0 0  PHQ - 2 Score 0 0  Altered sleeping - 0  Tired, decreased energy - 0  Change in appetite - 0  Feeling bad or failure about yourself  - 0  Trouble concentrating - 0  Moving slowly or fidgety/restless - 0  Suicidal thoughts - 0  PHQ-9 Score - 0  Difficult doing work/chores - Not difficult at all    No flowsheet data  found.  -------------------------------------------------------------------------- O: No physical exam performed due to remote telephone encounter.  Lab results reviewed.  No results found for this or any previous visit (from the past 2160 hour(s)).  -------------------------------------------------------------------------- A&P:  Problem List Items Addressed This Visit    None    Visit Diagnoses    Post herpetic neuralgia    -  Primary   Relevant Medications   gabapentin (NEURONTIN) 100 MG capsule     Clinically consistent with recent new 1st episode acute shingles herpes zoster outbreak onset 12 days ago, with near resolution of rash on Valtrex x 7 days already from urgent care Now with mild to moderate residual post herpetic neuralgia - improving - No evidence of complications, such as ocular involvement (ruled out by [facility] ophthalmology has 1 more follow-up)  Plan: Discussed course may linger for weeks, months or longer 1. Start Gabapentin titration dose as advised 100 to 300mg  QHS or TID - future can use PRN if need 2. Already finished Valtrex 3. F/u with eye doctor  Return if not improve consider other meds TCA vs Tramadol PRN  Future can get Shingrix vaccine >6 weeks after shingles has resolved.    Meds ordered this encounter  Medications  . gabapentin (NEURONTIN) 100 MG capsule    Sig: Start 1 capsule daily, increase by 1 cap every 2-3 days as tolerated up to 3 times a day, or may take 3 at once in evening.    Dispense:  90 capsule    Refill:  1    Follow-up: - Return in 4 weeks as needed if post herpetic shingles pain not resolved  Patient verbalizes understanding with the above medical recommendations including the limitation of remote medical advice.  Specific follow-up and call-back criteria were given for patient to follow-up or seek medical care more urgently if needed.   - Time spent in direct consultation with patient on phone: 9  minutes   , DO [facility] 04/09/2019, 12:11 PM

## 2019-04-09 NOTE — Patient Instructions (Signed)
Start the anti-viral medication - Valtrex take one 3 times a day for 7 days to complete the entire course. This will help the current flare up heal, including the rash to dry up and resolve quicker. It may take several days to weeks for the rash to completely resolve. Typically it will dry up and scab off.  Blister and Rash Care  Take a cool bath or apply cool compresses to the area of the rash or blisters as directed by your health care provider. This may help with pain and itching.  Keep your rash covered with a loose bandage (dressing). Wear loose-fitting clothing to help ease the pain of material rubbing against the rash.  Keep your rash and blisters clean with mild soap and cool water or as directed by your health care provider.  Check your rash every day for signs of infection. These include redness, swelling, and pain that lasts or increases.  Do not pick your blisters.  Do not scratch your rash.  As mentioned, some patients experience "Post-Herpetic Neuralgia" or a persistent sensation or feeling of "nerve irritation or pain" that can last for days to weeks to months or longer in this same area.  You may try the nerve medicine to help ease these symptoms now or in the future if it is needed. Try Gabapentin 100mg  capsules, take at night for 2-3 nights only, and then increase to 2 times a day for a few days, and then may increase to 3 times a day, it may make you drowsy, if helps significantly at night only, then you can increase instead to 3 capsules at night, instead of 3 times a day - In the future if needed, we can significantly increase the dose if tolerated well, some common doses are 300mg  three times a day up to 600mg  three times a day, usually it takes several weeks or months to get to higher doses  It is contagious to patients who have never had Chicken Pox - or someone who is pregnant (unborn baby is at risk) - or elderly with weakened immune system. Try to avoid direct close  contact with others on the skin if you have active blisters. Once the rash dries up and heals, it is less of a concern.  You may get the Shingles vaccine (Shingrix) approximately 6 weeks after the rash and episode is resolved. It is a two dose series of vaccines.    Contact your doctor if:  Your pain is not relieved with prescribed medicines.  Your pain does not get better after the rash heals.  Your rash looks infected. Signs of infection include redness, swelling, and pain that lasts or increases.  Please seek more immediate medical attention at the [facility] Emergency Department IF  The rash is on your face or nose.  You have facial pain, pain around your eye area, or loss of feeling on one side of your face.  You have ear pain or you have ringing in your ear.  You have loss of taste.  Your condition gets worse.    ------------------------------------------------------------------------------------------------------- Please review the additional information below about Shingles, if you have additional concerns.  Shingles Shingles, which is also known as herpes zoster, is an infection that causes a painful skin rash and fluid-filled blisters. Shingles is not related to genital herpes, which is a sexually transmitted infection. Shingles only develops in people who:  Have had chickenpox.  Have received the chickenpox vaccine. (This is rare.)  What are the causes? Shingles  is caused by varicella-zoster virus (VZV). This is the same virus that causes chickenpox. After exposure to VZV, the virus stays in the body in an inactive (dormant) state. Shingles develops if the virus reactivates. This can happen many years after the initial exposure to VZV. It is not known what causes this virus to reactivate. What increases the risk? People who have had chickenpox or received the chickenpox vaccine are at risk for shingles. Infection is more common in people who:  Are older than age  27.  Have a weakened defense (immune) system, such as those with HIV, AIDS, or cancer.  Are taking medicines that weaken the immune system, such as transplant medicines.  Are under great stress.  What are the signs or symptoms? Early symptoms of this condition include itching, tingling, and pain in an area on your skin. Pain may be described as burning, stabbing, or throbbing. A few days or weeks after symptoms start, a painful red rash appears, usually on one side of the body in a bandlike or beltlike pattern. The rash eventually turns into fluid-filled blisters that break open, scab over, and dry up in about 2-3 weeks. At any time during the infection, you may also develop:  A fever.  Chills.  A headache.  An upset stomach.  How is this diagnosed? This condition is diagnosed with a skin exam. Sometimes, skin or fluid samples are taken from the blisters before a diagnosis is made. These samples are examined under a microscope or sent to a lab for testing. How is this treated? There is no specific cure for this condition. Your health care provider will probably prescribe medicines to help you manage pain, recover more quickly, and avoid long-term problems. Medicines may include:  Antiviral drugs.  Anti-inflammatory drugs.  Pain medicines.  If the area involved is on your face, you may be referred to a specialist, such as an eye doctor (ophthalmologist) or an ear, nose, and throat (ENT) doctor to help you avoid eye problems, chronic pain, or disability. Follow these instructions at home: Medicines  Take medicines only as directed by your health care provider.  Apply an anti-itch or numbing cream to the affected area as directed by your health care provider. General instructions  Rest as directed by your health care provider.  Keep all follow-up visits as directed by your health care provider. This is important.  Until your blisters scab over, your infection can cause  chickenpox in people who have never had it or been vaccinated against it. To prevent this from happening, avoid contact with other people, especially: ? Babies. ? Pregnant women. ? Children who have eczema. ? Elderly people who have transplants. ? People who have chronic illnesses, such as leukemia or AIDS. This information is not intended to replace advice given to you by your health care provider. Make sure you discuss any questions you have with your health care provider. Document Released: 04/04/2005 Document Revised: 11/29/2015 Document Reviewed: 02/13/2014 Elsevier Interactive Patient Education  2018 [employer].    Please schedule a Follow-up Appointment to: Return in about 4 weeks (around 05/07/2019), or if symptoms worsen or fail to improve, for post shingles pain headache.  If you have any other questions or concerns, please feel free to call the office or send a message through MyChart. You may also schedule an earlier appointment if necessary.  Additionally, you may be receiving a survey about your experience at our office within a few days to 1 week by e-mail or  mail. We value your feedback.  Nobie Putnam, DO [place]

## 2019-04-15 ENCOUNTER — Telehealth: Payer: Self-pay | Admitting: Family Medicine

## 2019-04-15 DIAGNOSIS — B0229 Other postherpetic nervous system involvement: Secondary | ICD-10-CM

## 2019-04-15 MED ORDER — GABAPENTIN 300 MG PO CAPS: 300.0000 mg | ORAL_CAPSULE | Freq: Two times a day (BID) | ORAL | 2 refills | Status: DC

## 2019-04-15 NOTE — Telephone Encounter (Signed)
It depends on the severity of her symptoms and nerve pain after shingles.  If she has mild symptoms, but it just is not gone yet we have 2 options:  1. If she is taking Gabapentin 300mg  already, and it is helping we can increase it to either 300mg  twice a day, or we can use the 100mg  capsules again and have her take higher dose at one time such as 400 or 500 or eventually 600mg  in evening.  2. If she prefers a DIFFERENT med, we can send Nortriptyline 10mg  nightly. It should be taken every night and after every 5 days dose can be increased by 1 extra pill if needed, up to 20mg  then eventually max 3 pills for 30mg  if need. This is an anti-depressant med, she should wean off of it slowly if ready to discontinue. - She may still take Gabapentin with this med if she thinks it is helping.  If pain is more severe, then we can consider Tramadol - which is a stronger opiate pain medication. It can cause sedation. It is a controlled substance and only to be used short term.  Nobie Putnam, [facility] 04/15/2019, 1:22 PM

## 2019-04-15 NOTE — Addendum Note (Signed)
Addended by: Olin Hauser on: 04/15/2019 02:50 PM   Modules accepted: Orders

## 2019-04-15 NOTE — Telephone Encounter (Signed)
As per patient, last visit patient notified to call back if Sx not resolved or improved. She is taking Gabapentin but still has sxs and asking is there anything else she can add,  Plan: Discussed course may linger for weeks, months or longer 1. Start Gabapentin titration dose as advised 100 to 300mg  QHS or TID - future can use PRN if need 2. Already finished Valtrex 3. F/u with eye doctor  Return if not improve consider other meds TCA vs Tramadol PRN

## 2019-04-15 NOTE — Telephone Encounter (Signed)
Patient prefers increasing Gabapentin 300 mg twice daily.

## 2019-04-26 ENCOUNTER — Telehealth: Payer: Self-pay | Admitting: Nurse Practitioner

## 2019-04-26 DIAGNOSIS — B0229 Other postherpetic nervous system involvement: Secondary | ICD-10-CM

## 2019-04-26 MED ORDER — GABAPENTIN 300 MG PO CAPS: 300.0000 mg | ORAL_CAPSULE | Freq: Three times a day (TID) | ORAL | 2 refills | Status: DC

## 2019-04-26 NOTE — Telephone Encounter (Signed)
Pt called wanted to know if her medication can be increased not sure which mediation (name was not given) . Pt  Cal back # is  [phone]

## 2019-04-26 NOTE — Telephone Encounter (Addendum)
The pt has notice moderate improvement since the increase of Gabapentin from 300MG  daily to 300MG  twice daily, but admits she still having some breakthrough intermittent electric shoke headache pain. She said it is not as intense as it was in the beginning. She wanted to see if it would be safe to increase it just a little more.   I notified the patient of the options you gave her to either increase the gabapentin 300MG  to three times a day or change the medication. She requested to increase the Gabapentin, because she state the medication does help with the pain.

## 2019-04-26 NOTE — Telephone Encounter (Signed)
Can you contact patient to get more information?  She was seen on 12/22 by me for this, and I rx valtrex and gabapentin.  On 12/28 we talked again and we increased the dose from 300mg  daily up to 300mg  TWICE daily.  Can you clarify how her progress is going and more about her symptoms, and if she has a suggestion for dose? I would next offer Gabapentin 300mg  THREE times a day.  OR the Nortriptyline medicine, which was offered last time as well.  , DO [facility] 04/26/2019, 1:57 PM

## 2019-05-10 ENCOUNTER — Telehealth: Payer: Self-pay

## 2019-05-10 DIAGNOSIS — B0229 Other postherpetic nervous system involvement: Secondary | ICD-10-CM

## 2019-05-10 MED ORDER — GABAPENTIN 100 MG PO CAPS: 200.0000 mg | ORAL_CAPSULE | Freq: Two times a day (BID) | ORAL | 2 refills | Status: DC

## 2019-05-10 NOTE — Telephone Encounter (Signed)
The pt called and left a message on the voicemail requesting that you change the Gabapentin dosage because the current dosage of Gabapentin 300 MG TID is causing her to feel spacey. She doesn't want to go back to the 300MG  twice daily, but would like to decrease the dosage some. She state she is also willing to try a different medication if you prefer. Please advise

## 2019-05-10 NOTE — Telephone Encounter (Signed)
Called discussed her dose, reduced new rx to Gabapentin 100mg  x 2 pills = 200mg  BID, (4 pills a day for 120 a month) and she can use existing 300 at bedtime if need titrate down  , DO [facility] 05/10/2019, 1:45 PM

## 2019-05-14 ENCOUNTER — Ambulatory Visit (INDEPENDENT_AMBULATORY_CARE_PROVIDER_SITE_OTHER): Payer: BC Managed Care – PPO | Admitting: Obstetrics and Gynecology

## 2019-05-14 ENCOUNTER — Encounter: Payer: Self-pay | Admitting: Obstetrics and Gynecology

## 2019-05-14 ENCOUNTER — Other Ambulatory Visit: Payer: Self-pay

## 2019-05-14 VITALS — BP 124/80 | Ht 64.0 in | Wt 154.0 lb

## 2019-05-14 DIAGNOSIS — Z01419 Encounter for gynecological examination (general) (routine) without abnormal findings: Secondary | ICD-10-CM

## 2019-05-14 DIAGNOSIS — Z13220 Encounter for screening for lipoid disorders: Secondary | ICD-10-CM

## 2019-05-14 DIAGNOSIS — Z1231 Encounter for screening mammogram for malignant neoplasm of breast: Secondary | ICD-10-CM

## 2019-05-14 DIAGNOSIS — Z0000 Encounter for general adult medical examination without abnormal findings: Secondary | ICD-10-CM

## 2019-05-14 DIAGNOSIS — Z1211 Encounter for screening for malignant neoplasm of colon: Secondary | ICD-10-CM

## 2019-05-14 NOTE — Patient Instructions (Addendum)
I value your feedback and entrusting us with your care. If you get a [place] patient survey, I would appreciate you taking the time to let us know about your experience today. Thank you! ° °As of March 28, 2019, your lab results will be released to your MyChart immediately, before I even have a chance to see them. Please give me time to review them and contact you if there are any abnormalities. Thank you for your patience.  ° °[facility]: [phone] ° ° ° °

## 2019-05-14 NOTE — Progress Notes (Signed)
PCP:  Mikey College, NP (Inactive)   Chief Complaint  Patient presents with  . Gynecologic Exam     HPI:      Ms. Yolanda Tran is a 49 y.o. [phone] who LMP was Patient's last menstrual period was 04/11/2019 (approximate)., presents today for her annual examination.  Her menses are regular every 28-30 days, lasting 4-5 days.  Dysmenorrhea mild, occurring before bleeding. She does not have intermenstrual bleeding. No vasomotor sx.  Sex activity: single partner, contraception - tubal ligation. Uses lubricants for vaginal dryness. Last Pap: 06/06/17 Results were: no abnormalities /neg HPV DNA  Hx of STDs: none  Last mammogram: 11/15/18 Results were no abnormalities; repeat in 12 months.  There is a FH of breast cancer in her mat aunt and mat cousin, genetic testing not indicated. There is no FH of ovarian cancer. The patient does do self-breast exams.  Tobacco use: The patient denies current or previous tobacco use. Alcohol use: none No drug use.  Exercise: moderately active  Colonoscopy: never; no FH colon cancer  She does get adequate calcium but not Vitamin D in her diet. Borderline lipids and LFTs 05/2017. Due for repeat labs  Past Medical History:  Diagnosis Date  . Alkaline phosphatase elevation   . Allergy   . Hyperlipidemia   . Ovarian cyst   . Thrombocytopenia ([place])   . Vitamin D deficiency disease     Past Surgical History:  Procedure Laterality Date  . BREAST SURGERY  2003   tissue removed from breast  . TUBAL LIGATION  2002    Family History  Problem Relation Age of Onset  . Stroke Mother   . Hypertension Mother   . Heart disease Mother   . Depression Mother   . Diabetes Father   . Heart disease Father   . Congestive Heart Failure Father   . Cancer Father        bone  . Diabetes Paternal Grandmother   . Breast cancer Maternal Aunt 60       has contact  . Breast cancer Cousin        mid 7s, has contact  . Colon cancer Maternal  Aunt 36  . Healthy Daughter   . Healthy Son   . Stroke Maternal Grandmother   . Heart disease Maternal Grandfather   . COPD Neg Hx     Social History   Socioeconomic History  . Marital status: Married    Spouse name: Not on file  . Number of children: 2  . Years of education: Not on file  . Highest education level: Bachelor's degree (e.g., BA, AB, BS)  Occupational History  . Not on file  Tobacco Use  . Smoking status: Never Smoker  . Smokeless tobacco: Never Used  Substance and Sexual Activity  . Alcohol use: Yes    Comment: 2-3 monthly   . Drug use: No  . Sexual activity: Yes    Birth control/protection: None, Surgical    Comment: tubal ligation  Other Topics Concern  . Not on file  Social History Narrative  . Not on file   Social Determinants of Health   Financial Resource Strain:   . Difficulty of Paying Living Expenses: Not on file  Food Insecurity:   . Worried About Charity fundraiser in the Last Year: Not on file  . Ran Out of Food in the Last Year: Not on file  Transportation Needs:   . Lack of Transportation (Medical): Not on  file  . Lack of Transportation (Non-Medical): Not on file  Physical Activity:   . Days of Exercise per Week: Not on file  . Minutes of Exercise per Session: Not on file  Stress:   . Feeling of Stress : Not on file  Social Connections:   . Frequency of Communication with Friends and Family: Not on file  . Frequency of Social Gatherings with Friends and Family: Not on file  . Attends Religious Services: Not on file  . Active Member of Clubs or Organizations: Not on file  . Attends Banker Meetings: Not on file  . Marital Status: Not on file  Intimate Partner Violence:   . Fear of Current or Ex-Partner: Not on file  . Emotionally Abused: Not on file  . Physically Abused: Not on file  . Sexually Abused: Not on file    Current Meds  Medication Sig  . cetirizine (ZYRTEC) 10 MG tablet Take 10 mg by mouth daily.  .  Cholecalciferol (VITAMIN D-3 PO) Take by mouth daily.  . Cyanocobalamin (VITAMIN B-12 PO) Take by mouth daily.  Marland Kitchen FERROUS SULFATE PO Take by mouth daily.  Marland Kitchen gabapentin (NEURONTIN) 100 MG capsule Take 2 capsules (200 mg total) by mouth 2 (two) times daily. May take with 300mg  gabapentin pills currently if needed  . gabapentin (NEURONTIN) 300 MG capsule Take 1 capsule (300 mg total) by mouth 3 (three) times daily.     ROS:  Review of Systems  Constitutional: Negative for fatigue, fever and unexpected weight change.  Respiratory: Negative for cough, shortness of breath and wheezing.   Cardiovascular: Negative for chest pain, palpitations and leg swelling.  Gastrointestinal: Negative for blood in stool, constipation, diarrhea, nausea and vomiting.  Endocrine: Negative for cold intolerance, heat intolerance and polyuria.  Genitourinary: Negative for dyspareunia, dysuria, flank pain, frequency, genital sores, hematuria, menstrual problem, pelvic pain, urgency, vaginal bleeding, vaginal discharge and vaginal pain.  Musculoskeletal: Negative for back pain, joint swelling and myalgias.  Skin: Negative for rash.  Neurological: Negative for dizziness, syncope, light-headedness, numbness and headaches.  Hematological: Negative for adenopathy.  Psychiatric/Behavioral: Negative for agitation, confusion, sleep disturbance and suicidal ideas. The patient is not nervous/anxious.      Objective: BP 124/80   Ht 5\' 4"  (1.626 m)   Wt 154 lb (69.9 kg)   LMP 04/11/2019 (Approximate)   BMI 26.43 kg/m    Physical Exam Constitutional:      Appearance: She is well-developed.  Genitourinary:     Vulva, vagina, uterus, right adnexa and left adnexa normal.     No vulval lesion or tenderness noted.     No vaginal discharge, erythema or tenderness.     No cervical motion tenderness or polyp.     Uterus is not enlarged or tender.     No right or left adnexal mass present.     Right adnexa not tender.      Left adnexa not tender.  Neck:     Thyroid: No thyromegaly.  Cardiovascular:     Rate and Rhythm: Normal rate and regular rhythm.     Heart sounds: Normal heart sounds. No murmur.  Pulmonary:     Effort: Pulmonary effort is normal.     Breath sounds: Normal breath sounds.  Chest:     Breasts:        Right: No mass, nipple discharge, skin change or tenderness.        Left: No mass, nipple discharge, skin change or tenderness.  Abdominal:     Palpations: Abdomen is soft.     Tenderness: There is no abdominal tenderness. There is no guarding.  Musculoskeletal:        General: Normal range of motion.     Cervical back: Normal range of motion.  Neurological:     General: No focal deficit present.     Mental Status: She is alert and oriented to person, place, and time.     Cranial Nerves: No cranial nerve deficit.  Skin:    General: Skin is warm and dry.  Psychiatric:        Mood and Affect: Mood normal.        Behavior: Behavior normal.        Thought Content: Thought content normal.        Judgment: Judgment normal.  Vitals reviewed.     Assessment/Plan: Encounter for annual routine gynecological examination  Encounter for screening mammogram for malignant neoplasm of breast - Plan: MM 3D SCREEN BREAST BILATERAL; pt current on mammo  Screening for colon cancer - Plan: Ambulatory referral to Gastroenterology; Refer to GI due to age  Blood tests for routine general physical examination - Plan: Comprehensive metabolic panel, Lipid panel  Screening cholesterol level - Plan: Lipid panel          GYN counsel breast self exam, mammography screening, adequate intake of calcium and vitamin D, diet and exercise     F/U  Return in about 1 year (around 05/13/2020).  Aerith Canal B. Bao Coreas, PA-C 05/14/2019 4:39 PM

## 2019-05-20 ENCOUNTER — Ambulatory Visit: Payer: BC Managed Care – PPO | Attending: Internal Medicine

## 2019-05-20 DIAGNOSIS — Z20822 Contact with and (suspected) exposure to covid-19: Secondary | ICD-10-CM

## 2019-05-21 ENCOUNTER — Encounter: Payer: Self-pay | Admitting: *Deleted

## 2019-05-21 LAB — NOVEL CORONAVIRUS, NAA: SARS-CoV-2, NAA: NOT DETECTED

## 2019-05-29 ENCOUNTER — Telehealth: Payer: Self-pay | Admitting: Gastroenterology

## 2019-05-29 NOTE — Telephone Encounter (Signed)
Can you please call patient.

## 2019-05-29 NOTE — Telephone Encounter (Signed)
Pt left vm returning a call for [facility] for scheduling a colonoscopy, she states she like to schedule for March or April

## 2019-05-30 ENCOUNTER — Telehealth: Payer: Self-pay

## 2019-05-30 NOTE — Telephone Encounter (Signed)
Returned call to schedule patients colonoscopy (in [place]).  LVM for her to call back.  Thanks,  Gerald, [place]

## 2019-09-23 ENCOUNTER — Other Ambulatory Visit: Payer: BC Managed Care – PPO

## 2019-09-23 ENCOUNTER — Telehealth: Payer: Self-pay

## 2019-09-23 NOTE — Telephone Encounter (Signed)
Pt aware.

## 2019-09-23 NOTE — Telephone Encounter (Signed)
Orders were in system already, just needed to be released. [facility] has released them, call pt, no answer, LVMTRC.

## 2019-09-23 NOTE — Telephone Encounter (Signed)
Pt states she sees [facility], her last appointment was 04/2019. [facility] had told her she could go anywhere to get lab work. She had forgotten until last week about the lab work that had been ordered. She went to a draw station today and the tech said an order needs to be placed. CB# [phone]

## 2019-09-27 LAB — COMPREHENSIVE METABOLIC PANEL
ALT: 21 IU/L (ref 0–32)
AST: 20 IU/L (ref 0–40)
Albumin/Globulin Ratio: 1.7 (ref 1.2–2.2)
Albumin: 4.3 g/dL (ref 3.8–4.8)
Alkaline Phosphatase: 118 IU/L (ref 48–121)
BUN/Creatinine Ratio: 15 (ref 9–23)
BUN: 11 mg/dL (ref 6–24)
Bilirubin Total: 0.4 mg/dL (ref 0.0–1.2)
CO2: 19 mmol/L — ABNORMAL LOW (ref 20–29)
Calcium: 9.4 mg/dL (ref 8.7–10.2)
Chloride: 105 mmol/L (ref 96–106)
Creatinine, Ser: 0.75 mg/dL (ref 0.57–1.00)
GFR calc Af Amer: 107 mL/min/{1.73_m2} (ref >59)
GFR calc non Af Amer: 93 mL/min/{1.73_m2} (ref >59)
Globulin, Total: 2.6 g/dL (ref 1.5–4.5)
Glucose: 86 mg/dL (ref 65–99)
Potassium: 4.2 mmol/L (ref 3.5–5.2)
Sodium: 138 mmol/L (ref 134–144)
Total Protein: 6.9 g/dL (ref 6.0–8.5)

## 2019-09-27 LAB — LIPID PANEL
Chol/HDL Ratio: 4.5 ratio — ABNORMAL HIGH (ref 0.0–4.4)
Cholesterol, Total: 246 mg/dL — ABNORMAL HIGH (ref 100–199)
HDL: 55 mg/dL (ref >39)
LDL Chol Calc (NIH): 170 mg/dL — ABNORMAL HIGH (ref 0–99)
Triglycerides: 117 mg/dL (ref 0–149)
VLDL Cholesterol Cal: 21 mg/dL (ref 5–40)

## 2019-10-01 ENCOUNTER — Telehealth (INDEPENDENT_AMBULATORY_CARE_PROVIDER_SITE_OTHER): Payer: Self-pay | Admitting: Gastroenterology

## 2019-10-01 ENCOUNTER — Other Ambulatory Visit: Payer: Self-pay

## 2019-10-01 ENCOUNTER — Other Ambulatory Visit: Payer: Self-pay | Admitting: Obstetrics and Gynecology

## 2019-10-01 DIAGNOSIS — Z1211 Encounter for screening for malignant neoplasm of colon: Secondary | ICD-10-CM

## 2019-10-01 DIAGNOSIS — E785 Hyperlipidemia, unspecified: Secondary | ICD-10-CM

## 2019-10-01 NOTE — Progress Notes (Signed)
Gastroenterology Pre-Procedure Review  Request Date: Monday 10/28/19 Requesting Physician: Dr. Tobi Bastos  PATIENT REVIEW QUESTIONS: The patient responded to the following health history questions as indicated:    1. Are you having any GI issues? no 2. Do you have a personal history of Polyps? no 3. Do you have a family history of Colon Cancer or Polyps? no 4. Diabetes Mellitus? no 5. Joint replacements in the past 12 months?no 6. Major health problems in the past 3 months?no 7. Any artificial heart valves, MVP, or defibrillator?no    MEDICATIONS & ALLERGIES:    Patient reports the following regarding taking any anticoagulation/antiplatelet therapy:   Plavix, Coumadin, Eliquis, Xarelto, Lovenox, Pradaxa, Brilinta, or Effient? no Aspirin? no  Patient confirms/reports the following medications:  Current Outpatient Medications  Medication Sig Dispense Refill  . cetirizine (ZYRTEC) 10 MG tablet Take 10 mg by mouth daily.    . Cholecalciferol (VITAMIN D-3 PO) Take by mouth daily.    . Cyanocobalamin (VITAMIN B-12 PO) Take by mouth daily.    Marland Kitchen FERROUS SULFATE PO Take by mouth daily.    Marland Kitchen gabapentin (NEURONTIN) 100 MG capsule Take 2 capsules (200 mg total) by mouth 2 (two) times daily. May take with 300mg  gabapentin pills currently if needed 120 capsule 2  . gabapentin (NEURONTIN) 300 MG capsule Take 1 capsule (300 mg total) by mouth 3 (three) times daily. 90 capsule 2   No current facility-administered medications for this visit.    Patient confirms/reports the following allergies:  No Known Allergies  No orders of the defined types were placed in this encounter.   AUTHORIZATION INFORMATION Primary Insurance: 1D#: Group #:  Secondary Insurance: 1D#: Group #:  SCHEDULE INFORMATION: Date: Monday 10/28/19 Time: Location:[facility]

## 2019-10-01 NOTE — Progress Notes (Signed)
Pt aware of elevated lipids. Had not been doing diet/exericse changes but is doing them now. Has decreased fried foods and is exercising now. Discussed limited sat fats. FH CVA in mom late 65s and heard disease. Recommended statin but pt wants to do diet changes first. Will rechk lipids in 3 months. Pt aware

## 2019-10-17 ENCOUNTER — Other Ambulatory Visit: Payer: Self-pay

## 2019-10-17 DIAGNOSIS — Z1211 Encounter for screening for malignant neoplasm of colon: Secondary | ICD-10-CM

## 2019-10-31 ENCOUNTER — Encounter: Payer: Self-pay | Admitting: Gastroenterology

## 2019-10-31 ENCOUNTER — Other Ambulatory Visit: Payer: Self-pay

## 2019-11-08 ENCOUNTER — Other Ambulatory Visit
Admission: RE | Admit: 2019-11-08 | Discharge: 2019-11-08 | Disposition: A | Discharge status: Home / Self Care | Payer: BC Managed Care – PPO | Source: Ambulatory Visit | Attending: Gastroenterology | Admitting: Gastroenterology

## 2019-11-08 ENCOUNTER — Other Ambulatory Visit: Payer: Self-pay

## 2019-11-08 DIAGNOSIS — Z20822 Contact with and (suspected) exposure to covid-19: Secondary | ICD-10-CM | POA: Insufficient documentation

## 2019-11-08 DIAGNOSIS — Z01812 Encounter for preprocedural laboratory examination: Secondary | ICD-10-CM | POA: Diagnosis not present

## 2019-11-09 LAB — SARS CORONAVIRUS 2 (TAT 6-24 HRS): SARS Coronavirus 2: NEGATIVE

## 2019-11-11 NOTE — Discharge Instructions (Signed)
General Anesthesia, Adult, Care After This sheet gives you information about how to care for yourself after your procedure. Your health care provider may also give you more specific instructions. If you have problems or questions, contact your health care provider. What can I expect after the procedure? After the procedure, the following side effects are common:  Pain or discomfort at the IV site.  Nausea.  Vomiting.  Sore throat.  Trouble concentrating.  Feeling cold or chills.  Weak or tired.  Sleepiness and fatigue.  Soreness and body aches. These side effects can affect parts of the body that were not involved in surgery. Follow these instructions at home:  For at least 24 hours after the procedure:  Have a responsible adult stay with you. It is important to have someone help care for you until you are awake and alert.  Rest as needed.  Do not: ? Participate in activities in which you could fall or become injured. ? Drive. ? Use heavy machinery. ? Drink alcohol. ? Take sleeping pills or medicines that cause drowsiness. ? Make important decisions or sign legal documents. ? Take care of children on your own. Eating and drinking  Follow any instructions from your health care provider about eating or drinking restrictions.  When you feel hungry, start by eating small amounts of foods that are soft and easy to digest (bland), such as toast. Gradually return to your regular diet.  Drink enough fluid to keep your urine pale yellow.  If you vomit, rehydrate by drinking water, juice, or clear broth. General instructions  If you have sleep apnea, surgery and certain medicines can increase your risk for breathing problems. Follow instructions from your health care provider about wearing your sleep device: ? Anytime you are sleeping, including during daytime naps. ? While taking prescription pain medicines, sleeping medicines, or medicines that make you drowsy.  Return to  your normal activities as told by your health care provider. Ask your health care provider what activities are safe for you.  Take over-the-counter and prescription medicines only as told by your health care provider.  If you smoke, do not smoke without supervision.  Keep all follow-up visits as told by your health care provider. This is important. Contact a health care provider if:  You have nausea or vomiting that does not get better with medicine.  You cannot eat or drink without vomiting.  You have pain that does not get better with medicine.  You are unable to pass urine.  You develop a skin rash.  You have a fever.  You have redness around your IV site that gets worse. Get help right away if:  You have difficulty breathing.  You have chest pain.  You have blood in your urine or stool, or you vomit blood. Summary  After the procedure, it is common to have a sore throat or nausea. It is also common to feel tired.  Have a responsible adult stay with you for the first 24 hours after general anesthesia. It is important to have someone help care for you until you are awake and alert.  When you feel hungry, start by eating small amounts of foods that are soft and easy to digest (bland), such as toast. Gradually return to your regular diet.  Drink enough fluid to keep your urine pale yellow.  Return to your normal activities as told by your health care provider. Ask your health care provider what activities are safe for you. This information is not   intended to replace advice given to you by your health care provider. Make sure you discuss any questions you have with your health care provider. Document Revised: 04/07/2017 Document Reviewed: 11/18/2016 Elsevier Patient Education  2020 Elsevier Inc.  

## 2019-11-12 ENCOUNTER — Ambulatory Visit
Admission: RE | Admit: 2019-11-12 | Discharge: 2019-11-12 | Disposition: A | Discharge status: Home / Self Care | Payer: BC Managed Care – PPO | Attending: Gastroenterology | Admitting: Gastroenterology

## 2019-11-12 ENCOUNTER — Ambulatory Visit: Payer: BC Managed Care – PPO | Admitting: Anesthesiology

## 2019-11-12 ENCOUNTER — Encounter: Payer: Self-pay | Admitting: Gastroenterology

## 2019-11-12 ENCOUNTER — Encounter
Admission: RE | Disposition: A | Discharge status: Home / Self Care | Payer: Self-pay | Source: Home / Self Care | Attending: Gastroenterology

## 2019-11-12 DIAGNOSIS — Z79899 Other long term (current) drug therapy: Secondary | ICD-10-CM | POA: Diagnosis not present

## 2019-11-12 DIAGNOSIS — B0229 Other postherpetic nervous system involvement: Secondary | ICD-10-CM | POA: Diagnosis not present

## 2019-11-12 DIAGNOSIS — Z1211 Encounter for screening for malignant neoplasm of colon: Secondary | ICD-10-CM | POA: Diagnosis not present

## 2019-11-12 DIAGNOSIS — E559 Vitamin D deficiency, unspecified: Secondary | ICD-10-CM | POA: Diagnosis not present

## 2019-11-12 HISTORY — PX: COLONOSCOPY WITH PROPOFOL: SHX5780

## 2019-11-12 HISTORY — DX: Family history of other specified conditions: Z84.89

## 2019-11-12 HISTORY — DX: Other postherpetic nervous system involvement: B02.29

## 2019-11-12 LAB — POCT PREGNANCY, URINE: Preg Test, Ur: NEGATIVE

## 2019-11-12 SURGERY — COLONOSCOPY WITH PROPOFOL
Anesthesia: General | Site: Rectum

## 2019-11-12 MED ORDER — OXYCODONE HCL 5 MG PO TABS: 5.0000 mg | ORAL_TABLET | Freq: Once | ORAL | Status: DC | PRN

## 2019-11-12 MED ORDER — OXYCODONE HCL 5 MG/5ML PO SOLN: 5.0000 mg | Freq: Once | ORAL | Status: DC | PRN

## 2019-11-12 MED ORDER — STERILE WATER FOR IRRIGATION IR SOLN: Status: DC | PRN

## 2019-11-12 MED ORDER — LIDOCAINE HCL (CARDIAC) PF 100 MG/5ML IV SOSY
PREFILLED_SYRINGE | INTRAVENOUS | Status: DC | PRN
  Administered 2019-11-12: 40 mg via INTRAVENOUS

## 2019-11-12 MED ORDER — PROPOFOL 10 MG/ML IV BOLUS
INTRAVENOUS | Status: DC | PRN
  Administered 2019-11-12 (×3): 20 mg via INTRAVENOUS
  Administered 2019-11-12: 40 mg via INTRAVENOUS
  Administered 2019-11-12: 20 mg via INTRAVENOUS
  Administered 2019-11-12: 80 mg via INTRAVENOUS
  Administered 2019-11-12 (×4): 20 mg via INTRAVENOUS

## 2019-11-12 MED ORDER — LACTATED RINGERS IV SOLN: INTRAVENOUS | Status: DC

## 2019-11-12 SURGICAL SUPPLY — 5 items
GOWN CVR UNV OPN BCK APRN NK (MISCELLANEOUS) ×2 IMPLANT
GOWN ISOL THUMB LOOP REG UNIV (MISCELLANEOUS) ×6
KIT ENDO PROCEDURE OLY (KITS) ×3 IMPLANT
MANIFOLD NEPTUNE II (INSTRUMENTS) ×3 IMPLANT
WATER STERILE IRR 250ML POUR (IV SOLUTION) ×3 IMPLANT

## 2019-11-12 NOTE — Op Note (Signed)
[facility]astroenter[facility] Patient Name: Yolanda Tran Procedure Date: 11/12/2019 8:46 AM MRN: [id number] Account #: [id number] Date of Birth: Apr 20, 1970 Admit Type: Outpatient Age: 49 Room: [facility] OR ROOM 01 Gender: Female Note Status: Finalized Procedure:             Colonoscopy Indications:           Screening for colorectal malignant neoplasm Providers:             Filomeno Cromley B. Maximino Greenland MD, MD Referring MD:          Galen Manila (Referring MD) Medicines:             Monitored Anesthesia Care Complications:         No immediate complications. Procedure:             Pre-Anesthesia Assessment:                        - Prior to the procedure, a History and Physical was                         performed, and patient medications, allergies and                         sensitivities were reviewed. The patient's tolerance                         of previous anesthesia was reviewed.                        - The risks and benefits of the procedure and the                         sedation options and risks were discussed with the                         patient. All questions were answered and informed                         consent was obtained.                        - Patient identification and proposed procedure were                         verified prior to the procedure by the physician, the                         nurse, the anesthetist and the technician. The                         procedure was verified in the pre-procedure area in                         the procedure room in the endoscopy suite.                        - ASA Grade Assessment: II - A patient with mild  systemic disease.                        - After reviewing the risks and benefits, the patient                         was deemed in satisfactory condition to undergo the                         procedure.                        After obtaining informed  consent, the colonoscope was                         passed under direct vision. Throughout the procedure,                         the patient's blood pressure, pulse, and oxygen                         saturations were monitored continuously. The                         Colonoscope was introduced through the anus and                         advanced to the the cecum, identified by appendiceal                         orifice and ileocecal valve. The colonoscopy was                         performed with ease. The patient tolerated the                         procedure well. The quality of the bowel preparation                         was good. Findings:      The perianal and digital rectal examinations were normal.      The rectum, sigmoid colon, descending colon, transverse colon, ascending       colon and cecum appeared normal.      The retroflexed view of the distal rectum and anal verge was normal and       showed no anal or rectal abnormalities. Impression:            - The rectum, sigmoid colon, descending colon,                         transverse colon, ascending colon and cecum are normal.                        - The distal rectum and anal verge are normal on                         retroflexion view.                        - No  specimens collected. Recommendation:        - Discharge patient to home.                        - Resume previous diet.                        - Continue present medications.                        - Repeat colonoscopy in 10 years for screening                         purposes.                        - Return to primary care physician as previously                         scheduled.                        - The findings and recommendations were discussed with                         the patient.                        - The findings and recommendations were discussed with                         the patient's family.                        - In the  future, if patient develops new symptoms such                         as blood per rectum, abdominal pain, weight loss,                         altered bowel habits or any other reason for concern,                         patient should discuss this with thier PCP as they may                         need a GI referral at that time or evaluation for need                         for colonoscopy earlier than the recommended screening                         colonoscopy.                        In addition, if patient's family history of colon                         cancer changes (no family history at this time) in the  future, earlier screening may be indicated and patient                         should discuss this with PCP as well. Procedure Code(s):     --- Professional ---                        [phone], Colonoscopy, flexible; diagnostic, including                         collection of specimen(s) by brushing or washing, when                         performed (separate procedure) Diagnosis Code(s):     --- Professional ---                        Z12.11, Encounter for screening for malignant neoplasm                         of colon CPT copyright 2019 American Medical Association. All rights reserved. The codes documented in this report are preliminary and upon coder review may  be revised to meet current compliance requirements.  Melodie Bouillon, MD Michel Bickers B. Maximino Greenland MD, MD 11/12/2019 9:17:51 AM This report has been signed electronically. Number of Addenda: 0 Note Initiated On: 11/12/2019 8:46 AM Scope Withdrawal Time: 0 hours 11 minutes 50 seconds  Total Procedure Duration: 0 hours 18 minutes 24 seconds       [facility]

## 2019-11-12 NOTE — H&P (Signed)
Melodie Bouillon, MD [address], [place], [place], [address] [address], [place], [place], [address] Phone: [phone]  Fax: [phone]  Primary Care Physician:  Smitty Cords, DO   Pre-Procedure History & Physical: HPI:  Yolanda Tran is a 49 y.o. female is here for a colonoscopy.   Past Medical History:  Diagnosis Date  . Alkaline phosphatase elevation   . Allergy   . Family history of adverse reaction to anesthesia    Mother - BP drops with propofol  . History of shingles 03/28/2019   Forehead  . Hyperlipidemia   . Ovarian cyst   . Post herpetic neuralgia   . Thrombocytopenia (HCC)   . Vitamin D deficiency disease     Past Surgical History:  Procedure Laterality Date  . BREAST SURGERY  2003   tissue removed from breast  . TUBAL LIGATION  2002    Prior to Admission medications   Medication Sig Start Date End Date Taking? Authorizing Provider  cetirizine (ZYRTEC) 10 MG tablet Take 10 mg by mouth daily.   Yes [provider]  Cholecalciferol (VITAMIN D-3 PO) Take by mouth daily.   Yes [provider]  Cyanocobalamin (VITAMIN B-12 PO) Take by mouth daily.   Yes [provider]  FERROUS SULFATE PO Take by mouth daily.   Yes [provider]  gabapentin (NEURONTIN) 100 MG capsule Take 2 capsules (200 mg total) by mouth 2 (two) times daily. May take with 300mg  gabapentin pills currently if needed 05/10/19  Yes Karamalegos, 05/12/19, DO  gabapentin (NEURONTIN) 300 MG capsule Take 1 capsule (300 mg total) by mouth 3 (three) times daily. 04/26/19  Yes Karamalegos, 06/24/19, DO  Multiple Vitamins-Minerals (MULTIVITAMIN WOMENS 50+ ADV PO) Take by mouth daily.   Yes [provider]    Allergies as of 10/01/2019  . (No Known Allergies)    Family History  Problem Relation Age of Onset  . Stroke Mother 1  . Hypertension Mother   . Heart disease Mother   . Depression Mother   .  Diabetes Father   . Heart disease Father   . Congestive Heart Failure Father   . Cancer Father        bone  . Diabetes Paternal Grandmother   . Breast cancer Maternal Aunt 60       has contact  . Breast cancer Cousin        mid 38s, has contact  . Colon cancer Maternal Aunt 60  . Healthy Daughter   . Healthy Son   . Stroke Maternal Grandmother   . Heart disease Maternal Grandfather   . COPD Neg Hx     Social History   Socioeconomic History  . Marital status: Married    Spouse name: Not on file  . Number of children: 2  . Years of education: Not on file  . Highest education level: Bachelor's degree (e.g., BA, AB, BS)  Occupational History  . Not on file  Tobacco Use  . Smoking status: Never Smoker  . Smokeless tobacco: Never Used  Vaping Use  . Vaping Use: Never used  Substance and Sexual Activity  . Alcohol use: Yes    Comment: 2-3 monthly   . Drug use: No  . Sexual activity: Yes    Birth control/protection: None, Surgical    Comment: tubal ligation  Other Topics Concern  . Not on file  Social History Narrative  . Not on file   Social  Determinants of Health   Financial Resource Strain:   . Difficulty of Paying Living Expenses:   Food Insecurity:   . Worried About Programme researcher, broadcasting/film/video in the Last Year:   . Barista in the Last Year:   Transportation Needs:   . Freight forwarder (Medical):   Marland Kitchen Lack of Transportation (Non-Medical):   Physical Activity:   . Days of Exercise per Week:   . Minutes of Exercise per Session:   Stress:   . Feeling of Stress :   Social Connections:   . Frequency of Communication with Friends and Family:   . Frequency of Social Gatherings with Friends and Family:   . Attends Religious Services:   . Active Member of Clubs or Organizations:   . Attends Banker Meetings:   Marland Kitchen Marital Status:   Intimate Partner Violence:   . Fear of Current or Ex-Partner:   . Emotionally Abused:   Marland Kitchen Physically Abused:   .  Sexually Abused:     Review of Systems: See HPI, otherwise negative ROS  Physical Exam: BP (!) 147/82   Pulse 74   Temp 97.9 F (36.6 C)   Resp 16   Ht 5\' 5"  (1.651 m)   Wt 67.1 kg   LMP 09/30/2019 (Approximate) Comment: pregnancy test negative 11/12/19  SpO2 96%   BMI 24.63 kg/m  General:   Alert,  pleasant and cooperative in NAD Head:  Normocephalic and atraumatic. Neck:  Supple; no masses or thyromegaly. Lungs:  Clear throughout to auscultation, normal respiratory effort.    Heart:  +S1, +S2, Regular rate and rhythm, No edema. Abdomen:  Soft, nontender and nondistended. Normal bowel sounds, without guarding, and without rebound.   Neurologic:  Alert and  oriented x4;  grossly normal neurologically.  Impression/Plan: Yolanda Tran is here for a colonoscopy to be performed for average risk screening.  Risks, benefits, limitations, and alternatives regarding  colonoscopy have been reviewed with the patient.  Questions have been answered.  All parties agreeable.   Blossom Hoops, MD  11/12/2019, 8:46 AM

## 2019-11-12 NOTE — Anesthesia Postprocedure Evaluation (Signed)
Anesthesia Post Note  Patient: Yolanda Tran  Procedure(s) Performed: COLONOSCOPY WITH PROPOFOL (N/A Rectum)     Patient location during evaluation: PACU Anesthesia Type: General Level of consciousness: awake and alert Pain management: pain level controlled Vital Signs Assessment: post-procedure vital signs reviewed and stable Respiratory status: spontaneous breathing, nonlabored ventilation, respiratory function stable and patient connected to nasal cannula oxygen Cardiovascular status: blood pressure returned to baseline and stable Postop Assessment: no apparent nausea or vomiting Anesthetic complications: no   No complications documented.  Orrin Brigham

## 2019-11-12 NOTE — Transfer of Care (Signed)
Immediate Anesthesia Transfer of Care Note  Patient: Yolanda Tran  Procedure(s) Performed: COLONOSCOPY WITH PROPOFOL (N/A Rectum)  Patient Location: PACU  Anesthesia Type: General  Level of Consciousness: awake, alert  and patient cooperative  Airway and Oxygen Therapy: Patient Spontanous Breathing and Patient connected to supplemental oxygen  Post-op Assessment: Post-op Vital signs reviewed, Patient's Cardiovascular Status Stable, Respiratory Function Stable, Patent Airway and No signs of Nausea or vomiting  Post-op Vital Signs: Reviewed and stable  Complications: No complications documented.

## 2019-11-12 NOTE — Anesthesia Procedure Notes (Signed)
Procedure Name: MAC Performed by: Quinnten Calvin, CRNA Pre-anesthesia Checklist: Patient identified, Emergency Drugs available, Suction available, Timeout performed and Patient being monitored Patient Re-evaluated:Patient Re-evaluated prior to induction Oxygen Delivery Method: Nasal cannula Placement Confirmation: positive ETCO2       

## 2019-11-12 NOTE — Anesthesia Preprocedure Evaluation (Signed)
Anesthesia Evaluation  Patient identified by MRN, date of birth, ID band Patient awake    Reviewed: NPO status   History of Anesthesia Complications (+) Family history of anesthesia reaction and history of anesthetic complications (Mother - BP drops with propofol)  Airway Mallampati: II  TM Distance: >3 FB Neck ROM: full    Dental no notable dental hx.    Pulmonary neg pulmonary ROS,    Pulmonary exam normal        Cardiovascular Exercise Tolerance: Good negative cardio ROS Normal cardiovascular exam     Neuro/Psych Shingles in 03/2019 > Post herpetic neuralgia negative psych ROS   GI/Hepatic negative GI ROS, Neg liver ROS,   Endo/Other  negative endocrine ROS  Renal/GU negative Renal ROS  negative genitourinary   Musculoskeletal   Abdominal   Peds  Hematology  (+) Blood dyscrasia, anemia ,   Anesthesia Other Findings Covid: NEG. HCG. NEG.  Reproductive/Obstetrics                             Anesthesia Physical Anesthesia Plan  ASA: II  Anesthesia Plan: General   Post-op Pain Management:    Induction:   PONV Risk Score and Plan: 3 and TIVA, Propofol infusion and Treatment may vary due to age or medical condition  Airway Management Planned:   Additional Equipment:   Intra-op Plan:   Post-operative Plan:   Informed Consent: I have reviewed the patients History and Physical, chart, labs and discussed the procedure including the risks, benefits and alternatives for the proposed anesthesia with the patient or authorized representative who has indicated his/her understanding and acceptance.       Plan Discussed with: CRNA  Anesthesia Plan Comments:         Anesthesia Quick Evaluation

## 2019-11-13 ENCOUNTER — Encounter: Payer: Self-pay | Admitting: Gastroenterology

## 2019-11-28 ENCOUNTER — Encounter: Payer: Self-pay | Admitting: Obstetrics and Gynecology

## 2019-11-28 ENCOUNTER — Other Ambulatory Visit: Payer: Self-pay

## 2019-11-28 ENCOUNTER — Ambulatory Visit
Admission: RE | Admit: 2019-11-28 | Discharge: 2019-11-28 | Disposition: A | Discharge status: Home / Self Care | Payer: BC Managed Care – PPO | Source: Ambulatory Visit | Attending: Obstetrics and Gynecology | Admitting: Obstetrics and Gynecology

## 2019-11-28 DIAGNOSIS — Z1231 Encounter for screening mammogram for malignant neoplasm of breast: Secondary | ICD-10-CM

## 2020-01-01 ENCOUNTER — Other Ambulatory Visit: Payer: Self-pay | Admitting: Family Medicine

## 2020-01-01 DIAGNOSIS — B0229 Other postherpetic nervous system involvement: Secondary | ICD-10-CM

## 2020-01-06 ENCOUNTER — Other Ambulatory Visit: Payer: Self-pay

## 2020-01-06 ENCOUNTER — Other Ambulatory Visit: Payer: BC Managed Care – PPO

## 2020-01-06 DIAGNOSIS — E785 Hyperlipidemia, unspecified: Secondary | ICD-10-CM

## 2020-01-07 ENCOUNTER — Encounter: Payer: Self-pay | Admitting: Obstetrics and Gynecology

## 2020-01-07 LAB — LIPID PANEL WITH LDL/HDL RATIO
Cholesterol, Total: 218 mg/dL — ABNORMAL HIGH (ref 100–199)
HDL: 55 mg/dL (ref >39)
LDL Chol Calc (NIH): 148 mg/dL — ABNORMAL HIGH (ref 0–99)
LDL/HDL Ratio: 2.7 ratio (ref 0.0–3.2)
Triglycerides: 83 mg/dL (ref 0–149)
VLDL Cholesterol Cal: 15 mg/dL (ref 5–40)

## 2020-01-29 ENCOUNTER — Other Ambulatory Visit: Payer: Self-pay | Admitting: Family Medicine

## 2020-01-29 DIAGNOSIS — B0229 Other postherpetic nervous system involvement: Secondary | ICD-10-CM

## 2020-03-02 ENCOUNTER — Other Ambulatory Visit: Payer: Self-pay | Admitting: Family Medicine

## 2020-03-02 DIAGNOSIS — B0229 Other postherpetic nervous system involvement: Secondary | ICD-10-CM

## 2020-03-30 ENCOUNTER — Other Ambulatory Visit: Payer: Self-pay | Admitting: Family Medicine

## 2020-03-30 DIAGNOSIS — B0229 Other postherpetic nervous system involvement: Secondary | ICD-10-CM

## 2020-04-24 ENCOUNTER — Other Ambulatory Visit: Payer: Self-pay

## 2020-04-24 ENCOUNTER — Telehealth (INDEPENDENT_AMBULATORY_CARE_PROVIDER_SITE_OTHER): Payer: BC Managed Care – PPO | Admitting: Family Medicine

## 2020-04-24 ENCOUNTER — Encounter: Payer: Self-pay | Admitting: Family Medicine

## 2020-04-24 DIAGNOSIS — R52 Pain, unspecified: Secondary | ICD-10-CM

## 2020-04-24 DIAGNOSIS — J0110 Acute frontal sinusitis, unspecified: Secondary | ICD-10-CM

## 2020-04-24 MED ORDER — IPRATROPIUM BROMIDE 0.06 % NA SOLN: 2.0000 | Freq: Four times a day (QID) | NASAL | 0 refills | Status: DC

## 2020-04-24 MED ORDER — BENZONATATE 100 MG PO CAPS: 100.0000 mg | ORAL_CAPSULE | Freq: Three times a day (TID) | ORAL | 0 refills | Status: DC | PRN

## 2020-04-24 NOTE — Patient Instructions (Addendum)
Start Atrovent nasal spray decongestant 2 sprays in each nostril up to 4 times daily for 7 days Start Tessalon Perls take 1 capsule up to 3 times a day as needed for cough  Advised COVID testing, gave sites and information how to sign up Work note sent to [employer] Follow-up message/call next week if COVID negative and if need other treatment, or if COVID positive and worsening criteria, would extend work note  Please schedule a Follow-up Appointment to: Return in about 1 week (around 05/01/2020), or if symptoms worsen or fail to improve, for sinusitis vs covid.  If you have any other questions or concerns, please feel free to call the office or send a message through MyChart. You may also schedule an earlier appointment if necessary.  Additionally, you may be receiving a survey about your experience at our office within a few days to 1 week by e-mail or mail. We value your feedback.  Saralyn Pilar, DO [facility], [place]

## 2020-04-24 NOTE — Progress Notes (Signed)
Virtual Visit via Telephone The purpose of this virtual visit is to provide medical care while limiting exposure to the novel coronavirus (COVID19) for both patient and office staff.  Consent was obtained for phone visit:  Yes.   Answered questions that patient had about telehealth interaction:  Yes.   I discussed the limitations, risks, security and privacy concerns of performing an evaluation and management service by telephone. I also discussed with the patient that there may be a patient responsible charge related to this service. The patient expressed understanding and agreed to proceed.  Patient Location: Home Provider Location: [facility] (Office)  Participants in virtual visit: - Patient: Yolanda Tran - CMA: Elvina Mattes, CMA - Provider: Dr Althea Charon  ---------------------------------------------------------------------- Chief Complaint  Patient presents with  . Sinus Problem    Body ache ,HA denies fever, cough or chill onset 3 days    S: Reviewed CMA documentation. I have called patient and gathered additional HPI as follows:  Sore Throat / Sinus Drainage / Myalgia / Headache Reports that symptoms started 3 days ago with headache sinus congestion and drainage with sore throat. Admits some body aches. Some improved today. She missed work starting [employer] similar to last sinus infection in 2020. - Tried OTC DayQuil, NyQuil, Tylenol. - Denies any known or suspected exposure to person with or possibly with COVID19.  Denies any fevers, chills, sweats, cough, shortness of breath, abdominal pain, diarrhea  Health Maintenance:  Completed COVID vaccine 2nd dose in Fall 2021  Not updated to Flu vaccine.  Past Medical History:  Diagnosis Date  . Alkaline phosphatase elevation   . Allergy   . Family history of adverse reaction to anesthesia    Mother - BP drops with propofol  . History of shingles 03/28/2019   Forehead  .  Hyperlipidemia   . Ovarian cyst   . Post herpetic neuralgia   . Thrombocytopenia (HCC)   . Vitamin D deficiency disease    Social History   Tobacco Use  . Smoking status: Never Smoker  . Smokeless tobacco: Never Used  Vaping Use  . Vaping Use: Never used  Substance Use Topics  . Alcohol use: Yes    Comment: 2-3 monthly   . Drug use: No    Current Outpatient Medications:  .  benzonatate (TESSALON) 100 MG capsule, Take 1 capsule (100 mg total) by mouth 3 (three) times daily as needed for cough., Disp: 30 capsule, Rfl: 0 .  cetirizine (ZYRTEC) 10 MG tablet, Take 10 mg by mouth daily., Disp: , Rfl:  .  Cholecalciferol (VITAMIN D-3 PO), Take by mouth daily., Disp: , Rfl:  .  Cyanocobalamin (VITAMIN B-12 PO), Take by mouth daily., Disp: , Rfl:  .  FERROUS SULFATE PO, Take by mouth daily., Disp: , Rfl:  .  gabapentin (NEURONTIN) 100 MG capsule, Take 2 capsules (200 mg total) by mouth 2 (two) times daily. May take with 300mg  gabapentin pills currently if needed, Disp: 120 capsule, Rfl: 2 .  gabapentin (NEURONTIN) 300 MG capsule, TAKE 1 CAPSULE(300 MG) BY MOUTH THREE TIMES DAILY, Disp: 90 capsule, Rfl: 0 .  ipratropium (ATROVENT) 0.06 % nasal spray, Place 2 sprays into both nostrils 4 (four) times daily. For up to 5-7 days then stop., Disp: 15 mL, Rfl: 0 .  Multiple Vitamins-Minerals (MULTIVITAMIN WOMENS 50+ ADV PO), Take by mouth daily., Disp: , Rfl:   Depression screen [facility] 2/9 04/09/2019 01/03/2018  Decreased Interest 0 0  Down, Depressed, Hopeless 0 0  PHQ - 2 Score 0 0  Altered sleeping - 0  Tired, decreased energy - 0  Change in appetite - 0  Feeling bad or failure about yourself  - 0  Trouble concentrating - 0  Moving slowly or fidgety/restless - 0  Suicidal thoughts - 0  PHQ-9 Score - 0  Difficult doing work/chores - Not difficult at all    No flowsheet data found.  -------------------------------------------------------------------------- O: No physical exam performed  due to remote telephone encounter.  Lab results reviewed.  No results found for this or any previous visit (from the past 2160 hour(s)).  -------------------------------------------------------------------------- A&P:  Problem List Items Addressed This Visit   None   Visit Diagnoses    Acute non-recurrent frontal sinusitis    -  Primary   Relevant Medications   ipratropium (ATROVENT) 0.06 % nasal spray   benzonatate (TESSALON) 100 MG capsule   Generalized body aches         Clinically with acute viral syndrome, vs sinusitis Cannot rule out COVID, no documented vaccine, but reported 2nd dose in Summer/Fall Constellation of symptoms, febrile No respiratory compromise by history reported  Start Atrovent nasal spray decongestant 2 sprays in each nostril up to 4 times daily for 7 days Start Tessalon Perls take 1 capsule up to 3 times a day as needed for cough  Advised COVID testing, gave sites and information how to sign up Work note sent to [employer] Follow-up message/call next week if COVID negative and if need other treatment, or if COVID positive and worsening criteria, would extend work note  Meds ordered this encounter  Medications  . ipratropium (ATROVENT) 0.06 % nasal spray    Sig: Place 2 sprays into both nostrils 4 (four) times daily. For up to 5-7 days then stop.    Dispense:  15 mL    Refill:  0  . benzonatate (TESSALON) 100 MG capsule    Sig: Take 1 capsule (100 mg total) by mouth 3 (three) times daily as needed for cough.    Dispense:  30 capsule    Refill:  0    Follow-up: - Return in 1 week if unresolved  Patient verbalizes understanding with the above medical recommendations including the limitation of remote medical advice.  Specific follow-up and call-back criteria were given for patient to follow-up or seek medical care more urgently if needed.   - Time spent in direct consultation with patient on phone: 9 minutes   Saralyn Pilar, DO [facility] 04/24/2020, 8:13 AM

## 2020-04-25 ENCOUNTER — Other Ambulatory Visit: Payer: BC Managed Care – PPO

## 2020-04-26 ENCOUNTER — Other Ambulatory Visit: Payer: Self-pay

## 2020-04-26 DIAGNOSIS — Z20822 Contact with and (suspected) exposure to covid-19: Secondary | ICD-10-CM

## 2020-04-29 LAB — NOVEL CORONAVIRUS, NAA: SARS-CoV-2, NAA: DETECTED — AB

## 2020-05-02 ENCOUNTER — Other Ambulatory Visit: Payer: Self-pay | Admitting: Family Medicine

## 2020-05-02 DIAGNOSIS — J0110 Acute frontal sinusitis, unspecified: Secondary | ICD-10-CM

## 2020-05-22 ENCOUNTER — Other Ambulatory Visit: Payer: Self-pay | Admitting: Family Medicine

## 2020-05-22 DIAGNOSIS — B0229 Other postherpetic nervous system involvement: Secondary | ICD-10-CM

## 2020-05-22 NOTE — Telephone Encounter (Signed)
Requested medications are due for refill today yes  Requested medications are on the active medication list yes  Last refill 1/8  Last visit 03/2019 last visit to address this med/dx  Future visit scheduled no  Notes to clinic Failed protocol due to no valid visit within 12  months.

## 2020-06-01 ENCOUNTER — Ambulatory Visit: Payer: Self-pay

## 2020-06-01 ENCOUNTER — Other Ambulatory Visit: Payer: Self-pay

## 2020-06-01 DIAGNOSIS — Z20822 Contact with and (suspected) exposure to covid-19: Secondary | ICD-10-CM

## 2020-06-01 LAB — POC COVID19 BINAXNOW: SARS Coronavirus 2 Ag: NEGATIVE

## 2020-06-20 ENCOUNTER — Other Ambulatory Visit: Payer: Self-pay | Admitting: Unknown Physician Specialty

## 2020-06-20 DIAGNOSIS — B0229 Other postherpetic nervous system involvement: Secondary | ICD-10-CM

## 2020-08-21 ENCOUNTER — Other Ambulatory Visit: Payer: Self-pay | Admitting: Unknown Physician Specialty

## 2020-08-21 DIAGNOSIS — B0229 Other postherpetic nervous system involvement: Secondary | ICD-10-CM

## 2020-09-07 NOTE — Progress Notes (Signed)
PCP:  Smitty Cords, DO   Chief Complaint  Patient presents with  . Gynecologic Exam    No concerns     HPI:      Ms. Yolanda Tran is a 50 y.o. [id number] who LMP was Patient's last menstrual period was 08/19/2020 (approximate)., presents today for her annual examination.  Her menses are regular every 28-30 days, lasting 6-7 days.  Dysmenorrhea mild, occurring before bleeding. She does not have intermenstrual bleeding. No vasomotor sx.  Sex activity: single partner, contraception - tubal ligation. Uses lubricants for vaginal dryness. Last Pap: 06/06/17 Results were: no abnormalities /neg HPV DNA  Hx of STDs: none  Last mammogram: 11/28/19 Results were no abnormalities; repeat in 12 months.  There is a FH of breast cancer in her mat aunt and mat cousin, genetic testing not indicated. There is no FH of ovarian cancer. The patient does self-breast exams.  Tobacco use: The patient denies current or previous tobacco use. Alcohol use: none No drug use.  Exercise: moderately active  Colonoscopy: 7/21 with [facility]; repeat due after 10 yrs  She does get adequate calcium and Vitamin D in her diet.  Elevated lipids 6/21. Did diet/exercise changes with improvement on 9/21 labs. Due for repeat labs  Past Medical History:  Diagnosis Date  . Alkaline phosphatase elevation   . Allergy   . Family history of adverse reaction to anesthesia    Mother - BP drops with propofol  . History of shingles 03/28/2019   Forehead  . Hyperlipidemia   . Ovarian cyst   . Post herpetic neuralgia   . Thrombocytopenia (HCC)   . Vitamin D deficiency disease     Past Surgical History:  Procedure Laterality Date  . BREAST SURGERY  2003   tissue removed from breast  . COLONOSCOPY WITH PROPOFOL N/A 11/12/2019   Procedure: COLONOSCOPY WITH PROPOFOL;  Surgeon: Pasty Spillers, MD;  Location[facility];  Service: Endoscopy;  Laterality: N/A;  . TUBAL LIGATION  2002     Family History  Problem Relation Age of Onset  . Stroke Mother 39  . Hypertension Mother   . Heart disease Mother   . Depression Mother   . Diabetes Father   . Heart disease Father   . Congestive Heart Failure Father   . Cancer Father        bone  . Diabetes Paternal Grandmother   . Breast cancer Maternal Aunt 60       has contact  . Breast cancer Cousin        mid 27s, has contact  . Colon cancer Maternal Aunt 60  . Healthy Daughter   . Healthy Son   . Stroke Maternal Grandmother   . Heart disease Maternal Grandfather   . COPD Neg Hx     Social History   Socioeconomic History  . Marital status: Married    Spouse name: Not on file  . Number of children: 2  . Years of education: Not on file  . Highest education level: Bachelor's degree (e.g., BA, AB, BS)  Occupational History  . Not on file  Tobacco Use  . Smoking status: Never Smoker  . Smokeless tobacco: Never Used  Vaping Use  . Vaping Use: Never used  Substance and Sexual Activity  . Alcohol use: Yes    Comment: 2-3 monthly   . Drug use: No  . Sexual activity: Yes    Birth control/protection: None, Surgical    Comment: tubal  ligation  Other Topics Concern  . Not on file  Social History Narrative  . Not on file   Social Determinants of Health   Financial Resource Strain: Not on file  Food Insecurity: Not on file  Transportation Needs: Not on file  Physical Activity: Not on file  Stress: Not on file  Social Connections: Not on file  Intimate Partner Violence: Not on file    Current Meds  Medication Sig  . cetirizine (ZYRTEC) 10 MG tablet Take 10 mg by mouth daily.  . Cholecalciferol (VITAMIN D-3 PO) Take by mouth daily.  . Cyanocobalamin (VITAMIN B-12 PO) Take by mouth daily.  Marland Kitchen FERROUS SULFATE PO Take by mouth daily.  Marland Kitchen gabapentin (NEURONTIN) 100 MG capsule TAKE 2 CAPSULES(200 MG) BY MOUTH TWICE DAILY. MAY TAKE WITH 300 MG GABAPENTIN PILLS CURRENTLY AS NEEDED  . gabapentin (NEURONTIN) 300  MG capsule TAKE 1 CAPSULE(300 MG) BY MOUTH THREE TIMES DAILY  . ipratropium (ATROVENT) 0.06 % nasal spray Place 2 sprays into both nostrils 4 (four) times daily. For up to 5-7 days then stop.  . Multiple Vitamins-Minerals (MULTIVITAMIN WOMENS 50+ ADV PO) Take by mouth daily.     ROS:  Review of Systems  Constitutional: Negative for fatigue, fever and unexpected weight change.  Respiratory: Negative for cough, shortness of breath and wheezing.   Cardiovascular: Negative for chest pain, palpitations and leg swelling.  Gastrointestinal: Negative for blood in stool, constipation, diarrhea, nausea and vomiting.  Endocrine: Negative for cold intolerance, heat intolerance and polyuria.  Genitourinary: Negative for dyspareunia, dysuria, flank pain, frequency, genital sores, hematuria, menstrual problem, pelvic pain, urgency, vaginal bleeding, vaginal discharge and vaginal pain.  Musculoskeletal: Negative for back pain, joint swelling and myalgias.  Skin: Negative for rash.  Neurological: Negative for dizziness, syncope, light-headedness, numbness and headaches.  Hematological: Negative for adenopathy.  Psychiatric/Behavioral: Negative for agitation, confusion, sleep disturbance and suicidal ideas. The patient is not nervous/anxious.      Objective: BP 110/70   Ht 5\' 5"  (1.651 m)   Wt 144 lb (65.3 kg)   LMP 08/19/2020 (Approximate)   BMI 23.96 kg/m    Physical Exam Constitutional:      Appearance: She is well-developed.  Genitourinary:     Vulva normal.     Right Labia: No rash, tenderness or lesions.    Left Labia: No tenderness, lesions or rash.    No vaginal discharge, erythema or tenderness.      Right Adnexa: not tender and no mass present.    Left Adnexa: not tender and no mass present.    No cervical motion tenderness, friability or polyp.     Uterus is not enlarged or tender.  Breasts:     Right: No mass, nipple discharge, skin change or tenderness.     Left: No mass,  nipple discharge, skin change or tenderness.    Neck:     Thyroid: No thyromegaly.  Cardiovascular:     Rate and Rhythm: Normal rate and regular rhythm.     Heart sounds: Normal heart sounds. No murmur heard.   Pulmonary:     Effort: Pulmonary effort is normal.     Breath sounds: Normal breath sounds.  Abdominal:     Palpations: Abdomen is soft.     Tenderness: There is no abdominal tenderness. There is no guarding or rebound.  Musculoskeletal:        General: Normal range of motion.     Cervical back: Normal range of motion.  Lymphadenopathy:  Cervical: No cervical adenopathy.  Neurological:     General: No focal deficit present.     Mental Status: She is alert and oriented to person, place, and time.     Cranial Nerves: No cranial nerve deficit.  Skin:    General: Skin is warm and dry.  Psychiatric:        Mood and Affect: Mood normal.        Behavior: Behavior normal.        Thought Content: Thought content normal.        Judgment: Judgment normal.  Vitals reviewed.     Assessment/Plan: Encounter for annual routine gynecological examination  Encounter for screening mammogram for malignant neoplasm of breast - Plan: MM 3D SCREEN BREAST BILATERAL; pt to sched mammo  Blood tests for routine general physical examination - Plan: Lipid panel,   Elevated lipids - Plan: Lipid panel, will f/u with results.            GYN counsel breast self exam, mammography screening, adequate intake of calcium and vitamin D, diet and exercise     F/U  Return in about 1 year (around 09/08/2021).  Samentha Perham B. [place] Kirchner, PA-C 09/08/2020 9:04 AM

## 2020-09-08 ENCOUNTER — Other Ambulatory Visit: Payer: Self-pay

## 2020-09-08 ENCOUNTER — Encounter: Payer: Self-pay | Admitting: Obstetrics and Gynecology

## 2020-09-08 ENCOUNTER — Ambulatory Visit (INDEPENDENT_AMBULATORY_CARE_PROVIDER_SITE_OTHER): Payer: BC Managed Care – PPO | Admitting: Obstetrics and Gynecology

## 2020-09-08 VITALS — BP 110/70 | Ht 65.0 in | Wt 144.0 lb

## 2020-09-08 DIAGNOSIS — Z1231 Encounter for screening mammogram for malignant neoplasm of breast: Secondary | ICD-10-CM | POA: Diagnosis not present

## 2020-09-08 DIAGNOSIS — E785 Hyperlipidemia, unspecified: Secondary | ICD-10-CM | POA: Diagnosis not present

## 2020-09-08 DIAGNOSIS — Z01419 Encounter for gynecological examination (general) (routine) without abnormal findings: Secondary | ICD-10-CM | POA: Diagnosis not present

## 2020-09-08 DIAGNOSIS — Z0000 Encounter for general adult medical examination without abnormal findings: Secondary | ICD-10-CM | POA: Diagnosis not present

## 2020-09-08 NOTE — Patient Instructions (Addendum)
I value your feedback and you entrusting us with your care. If you get a [facility] patient survey, I would appreciate you taking the time to let us know about your experience today. Thank you!  [facility]: [phone]      

## 2020-09-22 ENCOUNTER — Ambulatory Visit: Payer: Self-pay | Admitting: Family Medicine

## 2020-09-23 ENCOUNTER — Encounter: Payer: Self-pay | Admitting: Family Medicine

## 2020-09-23 ENCOUNTER — Ambulatory Visit: Payer: BC Managed Care – PPO | Admitting: Family Medicine

## 2020-09-23 ENCOUNTER — Other Ambulatory Visit: Payer: Self-pay

## 2020-09-23 VITALS — BP 126/72 | HR 95 | Ht 65.0 in | Wt 145.4 lb

## 2020-09-23 DIAGNOSIS — B0229 Other postherpetic nervous system involvement: Secondary | ICD-10-CM

## 2020-09-23 MED ORDER — GABAPENTIN 300 MG PO CAPS: 300.0000 mg | ORAL_CAPSULE | Freq: Every morning | ORAL | 1 refills | Status: DC

## 2020-09-23 MED ORDER — GABAPENTIN 100 MG PO CAPS: 200.0000 mg | ORAL_CAPSULE | Freq: Two times a day (BID) | ORAL | 1 refills | Status: DC

## 2020-09-23 NOTE — Progress Notes (Signed)
Subjective:    Patient ID: Yolanda Tran, female    DOB: [birth year], 50 y.o.   MRN: [id number]  Yolanda Tran is a 50 y.o. female presenting on 09/23/2020 for Herpes Zoster   HPI   Chronic Post Herpetic Neuralgia  Initial onset problem shingles acute shingles flare 03/2019 forehead mostly L sided. Significant flare initially, but no ophthalmic or other involvement. She has done well on gabapentin for that problem flare and then post herpetic neuralgia. Here today for refill on Gabapentin. Average dose is Gabapentin 300mg  in AM, then may slightly reduce dose in afternoon and evening doses 200mg  (x 2 of 100mg ), she is taking 3 doses a day, not skipping doses. If misses dose then she feels headache like symptoms and nerve pain it is mild only however. Denies any vision change, hearing loss, new rash, other pain  Depression screen [facility] 2/9 09/23/2020 04/09/2019 01/03/2018  Decreased Interest 0 0 0  Down, Depressed, Hopeless 0 0 0  PHQ - 2 Score 0 0 0  Altered sleeping 0 - 0  Tired, decreased energy 0 - 0  Change in appetite 0 - 0  Feeling bad or failure about yourself  0 - 0  Trouble concentrating 0 - 0  Moving slowly or fidgety/restless 0 - 0  Suicidal thoughts 0 - 0  PHQ-9 Score 0 - 0  Difficult doing work/chores Not difficult at all - Not difficult at all    Social History   Tobacco Use  . Smoking status: Never Smoker  . Smokeless tobacco: Never Used  Vaping Use  . Vaping Use: Never used  Substance Use Topics  . Alcohol use: Yes    Comment: 2-3 monthly   . Drug use: No    Review of Systems Per HPI unless specifically indicated above     Objective:    BP 126/72   Pulse 95   Ht 5\' 5"  (1.651 m)   Wt 145 lb 6.4 oz (66 kg)   SpO2 96%   BMI 24.20 kg/m   Wt Readings from Last 3 Encounters:  09/23/20 145 lb 6.4 oz (66 kg)  09/08/20 144 lb (65.3 kg)  11/12/19 148 lb (67.1 kg)    Physical Exam    Results for orders placed or performed in visit on  06/01/20  POC COVID-19  Result Value Ref Range   SARS Coronavirus 2 Ag Negative Negative      Assessment & Plan:   Problem List Items Addressed This Visit   None   Visit Diagnoses    Post herpetic neuralgia    -  Primary   Relevant Medications   gabapentin (NEURONTIN) 300 MG capsule   gabapentin (NEURONTIN) 100 MG capsule      Chronic Post Herpetic Neuralgia Initial shingles flare 03/2019 - resolved, L forehead, no complications except post herpetic neuralgia  Refill current Gabapentin for chronic neuropathic pain - 300mg  daily in AM (#90 pills for 90 day with 1 refill) - 100mg  x 2 for 200mg  BID (lunch, supper) (#360 pills for 90 day, +1 refill)  Advised she may gradually taper down dose as trial to reduce dosage if tolerated, let me know we can change order and update if need.  Or in future we can consider refer to Neurology for consultation, other med options TCA etc.  Meds ordered this encounter  Medications  . gabapentin (NEURONTIN) 300 MG capsule    Sig: Take 1 capsule (300 mg total) by mouth in the morning.  Dispense:  90 capsule    Refill:  1  . gabapentin (NEURONTIN) 100 MG capsule    Sig: Take 2 capsules (200 mg total) by mouth 2 (two) times daily before lunch and supper.    Dispense:  360 capsule    Refill:  1      Follow up plan: Return in about 6 months (around 03/25/2021) for 6 month follow-up post herpetic neuralgia.   Saralyn Pilar, DO [facility] 09/23/2020, 1:58 PM

## 2020-09-23 NOTE — Patient Instructions (Addendum)
Thank you for coming to the office today.  Try to gradually reduce the Gabapentin further. May try lowering afternoon / PM dose from 200 down to 100, or eventually also lowering AM dose. New orders sent.  300mg  AM / 200mg  afternoon / 200mg  PM.  90 day supply with refill   Please schedule a Follow-up Appointment to: Return in about 6 months (around 03/25/2021) for 6 month follow-up post herpetic neuralgia.  If you have any other questions or concerns, please feel free to call the office or send a message through MyChart. You may also schedule an earlier appointment if necessary.  Additionally, you may be receiving a survey about your experience at our office within a few days to 1 week by e-mail or mail. We value your feedback.  , DO [facility], 14/11/2020

## 2020-09-25 ENCOUNTER — Ambulatory Visit: Payer: Self-pay | Admitting: Family Medicine

## 2020-10-21 ENCOUNTER — Other Ambulatory Visit: Payer: BC Managed Care – PPO

## 2020-10-21 ENCOUNTER — Other Ambulatory Visit: Payer: Self-pay

## 2020-10-21 DIAGNOSIS — E785 Hyperlipidemia, unspecified: Secondary | ICD-10-CM

## 2020-10-21 DIAGNOSIS — Z0000 Encounter for general adult medical examination without abnormal findings: Secondary | ICD-10-CM

## 2020-10-22 ENCOUNTER — Encounter: Payer: Self-pay | Admitting: Obstetrics and Gynecology

## 2020-10-22 LAB — LIPID PANEL
Chol/HDL Ratio: 4.1 ratio (ref 0.0–4.4)
Cholesterol, Total: 210 mg/dL — ABNORMAL HIGH (ref 100–199)
HDL: 51 mg/dL (ref >39)
LDL Chol Calc (NIH): 142 mg/dL — ABNORMAL HIGH (ref 0–99)
Triglycerides: 94 mg/dL (ref 0–149)
VLDL Cholesterol Cal: 17 mg/dL (ref 5–40)

## 2020-12-10 ENCOUNTER — Ambulatory Visit
Admission: RE | Admit: 2020-12-10 | Discharge: 2020-12-10 | Disposition: A | Discharge status: Home / Self Care | Payer: BC Managed Care – PPO | Source: Ambulatory Visit | Attending: Obstetrics and Gynecology | Admitting: Obstetrics and Gynecology

## 2020-12-10 ENCOUNTER — Other Ambulatory Visit: Payer: Self-pay

## 2020-12-10 DIAGNOSIS — Z1231 Encounter for screening mammogram for malignant neoplasm of breast: Secondary | ICD-10-CM | POA: Diagnosis present

## 2021-03-12 ENCOUNTER — Other Ambulatory Visit: Payer: Self-pay | Admitting: Family Medicine

## 2021-03-12 DIAGNOSIS — B0229 Other postherpetic nervous system involvement: Secondary | ICD-10-CM

## 2021-03-12 NOTE — Telephone Encounter (Signed)
Requested Prescriptions  Pending Prescriptions Disp Refills  . gabapentin (NEURONTIN) 300 MG capsule [Pharmacy Med Name: GABAPENTIN 300MG  CAPSULES] 90 capsule 1    Sig: TAKE 1 CAPSULE(300 MG) BY MOUTH IN THE MORNING     Neurology: Anticonvulsants - gabapentin Passed - 03/12/2021  3:31 AM      Passed - Valid encounter within last 12 months    Recent Outpatient Visits          5 months ago Post herpetic neuralgia   [facility] [place], [place], DO   10 months ago Acute non-recurrent frontal sinusitis   [facility] [place], [place], DO   1 year ago Post herpetic neuralgia   [facility] [place], [place], DO   3 years ago Encounter for physical examination related to employment   [facility], [facility], NP   5 years ago Lumbar back pain with radiculopathy affecting left lower extremity   [facility] [place], [facility], MD             . gabapentin (NEURONTIN) 100 MG capsule [Pharmacy Med Name: GABAPENTIN 100MG  CAPSULES] 360 capsule 1    Sig: TAKE 2 CAPSULES(200 MG) BY MOUTH TWICE DAILY BEFORE LUNCH AND SUPPER     Neurology: Anticonvulsants - gabapentin Passed - 03/12/2021  3:31 AM      Passed - Valid encounter within last 12 months    Recent Outpatient Visits          5 months ago Post herpetic neuralgia   [facility] [place], [facility], DO   10 months ago Acute non-recurrent frontal sinusitis   [facility] [place], [facility], DO   1 year ago Post herpetic neuralgia   [facility] [place], [facility], DO   3 years ago Encounter for physical examination related to employment   [facility] [place], [facility], NP   5 years ago Lumbar back pain with radiculopathy affecting left lower extremity   [facility] [place], [place], MD

## 2021-06-03 ENCOUNTER — Telehealth: Payer: BC Managed Care – PPO | Admitting: Family Medicine

## 2021-06-03 DIAGNOSIS — J069 Acute upper respiratory infection, unspecified: Secondary | ICD-10-CM | POA: Diagnosis not present

## 2021-06-03 MED ORDER — FLUTICASONE PROPIONATE 50 MCG/ACT NA SUSP: 2.0000 | Freq: Every day | NASAL | 0 refills | Status: AC

## 2021-06-03 MED ORDER — BENZONATATE 100 MG PO CAPS: 100.0000 mg | ORAL_CAPSULE | Freq: Two times a day (BID) | ORAL | 0 refills | Status: DC | PRN

## 2021-06-03 NOTE — Progress Notes (Signed)
E-Visit for Upper Respiratory Infection  ° °We are sorry you are not feeling well.  Here is how we plan to help! ° °Based on what you have shared with me, it looks like you may have a viral upper respiratory infection.  Upper respiratory infections are caused by a large number of viruses; however, rhinovirus is the most common cause.  ° °Symptoms vary from person to person, with common symptoms including sore throat, cough, fatigue or lack of energy and feeling of general discomfort.  A low-grade fever of up to 100.4 may present, but is often uncommon.  Symptoms vary however, and are closely related to a person's age or underlying illnesses.  The most common symptoms associated with an upper respiratory infection are nasal discharge or congestion, cough, sneezing, headache and pressure in the ears and face.  These symptoms usually persist for about 3 to 10 days, but can last up to 2 weeks.  It is important to know that upper respiratory infections do not cause serious illness or complications in most cases.   ° °Upper respiratory infections can be transmitted from person to person, with the most common method of transmission being a person's hands.  The virus is able to live on the skin and can infect other persons for up to 2 hours after direct contact.  Also, these can be transmitted when someone coughs or sneezes; thus, it is important to cover the mouth to reduce this risk.  To keep the spread of the illness at bay, good hand hygiene is very important. ° °This is an infection that is most likely caused by a virus. There are no specific treatments other than to help you with the symptoms until the infection runs its course.  We are sorry you are not feeling well.  Here is how we plan to help! ° ° °For nasal congestion, you may use an oral decongestants such as Mucinex D or if you have glaucoma or high blood pressure use plain Mucinex.  Saline nasal spray or nasal drops can help and can safely be used as often as  needed for congestion.  For your congestion, I have prescribed Fluticasone nasal spray one spray in each nostril twice a day ° °If you do not have a history of heart disease, hypertension, diabetes or thyroid disease, prostate/bladder issues or glaucoma, you may also use Sudafed to treat nasal congestion.  It is highly recommended that you consult with a pharmacist or your primary care physician to ensure this medication is safe for you to take.    ° °If you have a cough, you may use cough suppressants such as Delsym and Robitussin.  If you have glaucoma or high blood pressure, you can also use Coricidin HBP.   °For cough I have prescribed for you A prescription cough medication called Tessalon Perles 100 mg. You may take 1-2 capsules every 8 hours as needed for cough ° °If you have a sore or scratchy throat, use a saltwater gargle- ¼ to ½ teaspoon of salt dissolved in a 4-ounce to 8-ounce glass of warm water.  Gargle the solution for approximately 15-30 seconds and then spit.  It is important not to swallow the solution.  You can also use throat lozenges/cough drops and Chloraseptic spray to help with throat pain or discomfort.  Warm or cold liquids can also be helpful in relieving throat pain. ° °For headache, pain or general discomfort, you can use Ibuprofen or Tylenol as directed.   °Some authorities believe   that zinc sprays or the use of Echinacea may shorten the course of your symptoms.   HOME CARE Only take medications as instructed by your medical team. Be sure to drink plenty of fluids. Water is fine as well as fruit juices, sodas and electrolyte beverages. You may want to stay away from caffeine or alcohol. If you are nauseated, try taking small sips of liquids. How do you know if you are getting enough fluid? Your urine should be a pale yellow or almost colorless. Get rest. Taking a steamy shower or using a humidifier may help nasal congestion and ease sore throat pain. You can place a towel over  your head and breathe in the steam from hot water coming from a faucet. Using a saline nasal spray works much the same way. Cough drops, hard candies and sore throat lozenges may ease your cough. Avoid close contacts especially the very young and the elderly Cover your mouth if you cough or sneeze Always remember to wash your hands.   GET HELP RIGHT AWAY IF: You develop worsening fever. If your symptoms do not improve within 10 days You develop yellow or green discharge from your nose over 3 days. You have coughing fits You develop a severe head ache or visual changes. You develop shortness of breath, difficulty breathing or start having chest pain Your symptoms persist after you have completed your treatment plan  MAKE SURE YOU  Understand these instructions. Will watch your condition. Will get help right away if you are not doing well or get worse.  Thank you for choosing an e-visit.  Your e-visit answers were reviewed by a board certified advanced clinical practitioner to complete your personal care plan. Depending upon the condition, your plan could have included both over the counter or prescription medications.  Please review your pharmacy choice. Make sure the pharmacy is open so you can pick up prescription now. If there is a problem, you may contact your provider through [employer] and have the prescription routed to another pharmacy.  Your safety is important to [place]. If you have drug allergies check your prescription carefully.   For the next 24 hours you can use MyChart to ask questions about today's visit, request a non-urgent call back, or ask for a work or school excuse. You will get an email in the next two days asking about your experience. I hope that your e-visit has been valuable and will speed your recovery.    I provided 5 minutes of non face-to-face time during this encounter for chart review, medication and order placement, as well as and documentation.     I provided 5 minutes of non face-to-face time during this encounter for chart review, medication and order placement, as well as and documentation.

## 2021-11-20 ENCOUNTER — Other Ambulatory Visit: Payer: Self-pay | Admitting: Family Medicine

## 2021-11-20 DIAGNOSIS — B0229 Other postherpetic nervous system involvement: Secondary | ICD-10-CM

## 2021-11-22 NOTE — Telephone Encounter (Signed)
Requested medication (s) are due for refill today: yes  Requested medication (s) are on the active medication list: yes  Last refill:  03/12/21 #90 1 refills  Future visit scheduled: no  Notes to clinic:  needs OV . Do you want to give courtesy refill? Protocol failed last lab 09/26/19     Requested Prescriptions  Pending Prescriptions Disp Refills   gabapentin (NEURONTIN) 300 MG capsule [Pharmacy Med Name: GABAPENTIN 300MG  CAPSULES] 90 capsule 1    Sig: TAKE 1 CAPSULE(300 MG) BY MOUTH IN THE MORNING     Neurology: Anticonvulsants - gabapentin Failed - 11/20/2021  3:31 AM      Failed - Cr in normal range and within 360 days    Creatinine, Ser  Date Value Ref Range Status  09/26/2019 0.75 0.57 - 1.00 mg/dL Final         Failed - Completed PHQ-2 or PHQ-9 in the last 360 days      Failed - Valid encounter within last 12 months    Recent Outpatient Visits           1 year ago Post herpetic neuralgia   [facility], DO   1 year ago Acute non-recurrent frontal sinusitis   [facility], DO   2 years ago Post herpetic neuralgia   [facility] [place], [place], DO   3 years ago Encounter for physical examination related to employment   [facility], Kyung Rudd, NP   6 years ago Lumbar back pain with radiculopathy affecting left lower extremity   [facility] [place], [facility], MD

## 2021-11-22 NOTE — Telephone Encounter (Signed)
Called patient to schedule appt. No answer , LVMTCB #[phone]. °

## 2021-11-29 ENCOUNTER — Other Ambulatory Visit: Payer: Self-pay | Admitting: Family Medicine

## 2021-11-29 DIAGNOSIS — Z1231 Encounter for screening mammogram for malignant neoplasm of breast: Secondary | ICD-10-CM

## 2021-12-10 IMAGING — MG MM DIGITAL SCREENING BILAT W/ TOMO AND CAD
8 series · 8 of 24 positions shown · non-contrast
Comparison: Previous exam(s).

CLINICAL DATA: Screening.

EXAM:
DIGITAL SCREENING BILATERAL MAMMOGRAM WITH TOMOSYNTHESIS AND CAD
TECHNIQUE: Bilateral screening digital craniocaudal and mediolateral oblique
mammograms were obtained. Bilateral screening digital breast
tomosynthesis was performed. The images were evaluated with
computer-aided detection.

[R CC synth-2D]
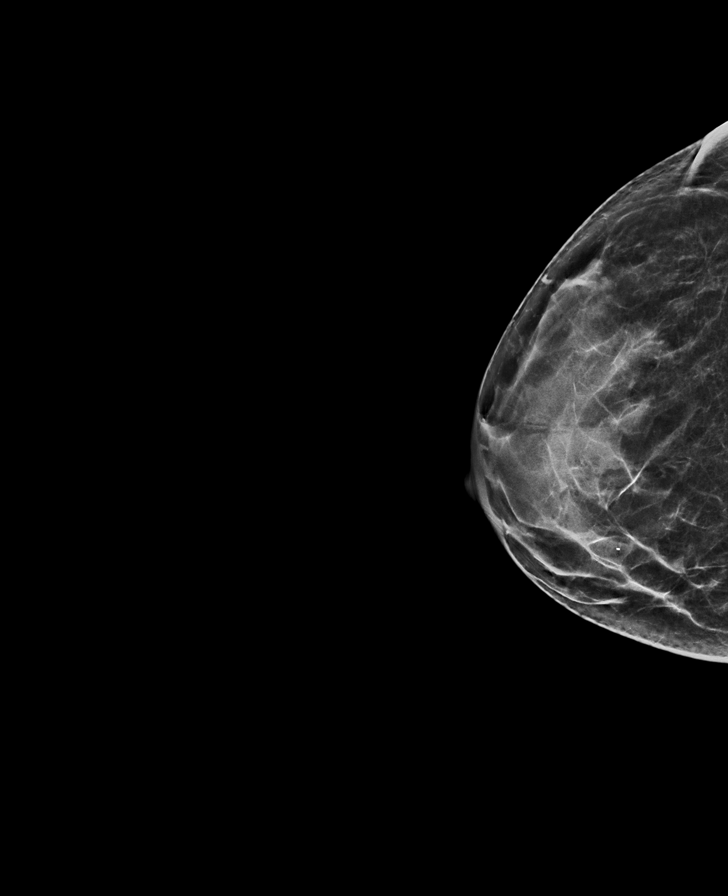

[L CC synth-2D]
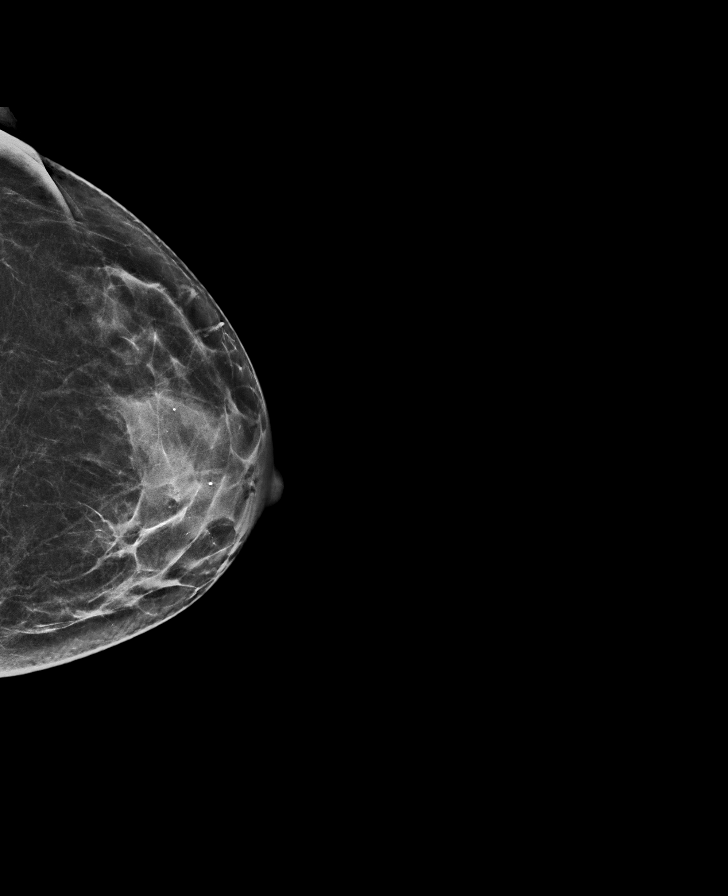

[R MLO synth-2D]
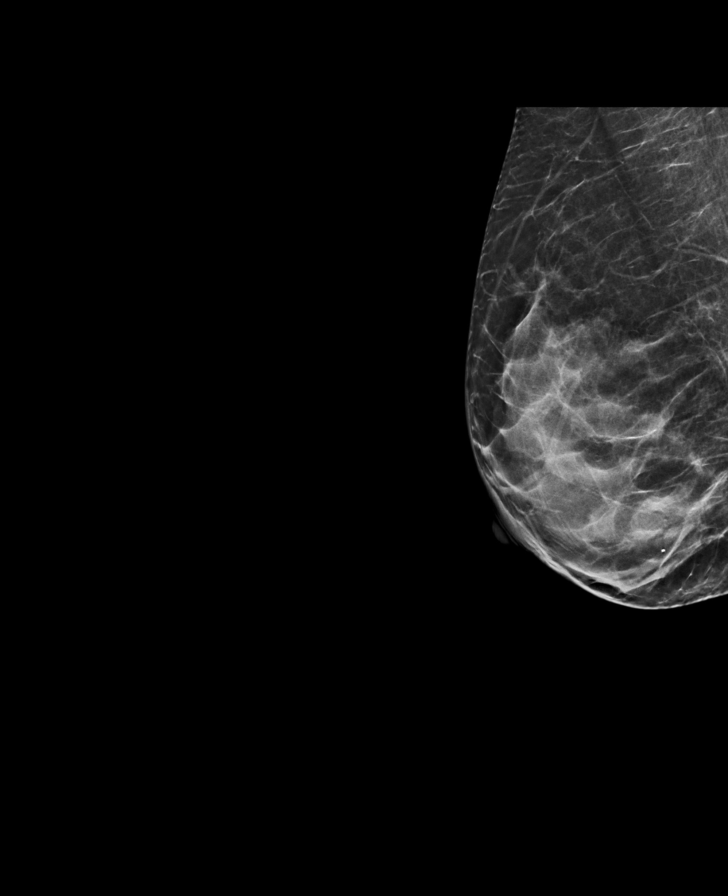

[L MLO synth-2D]
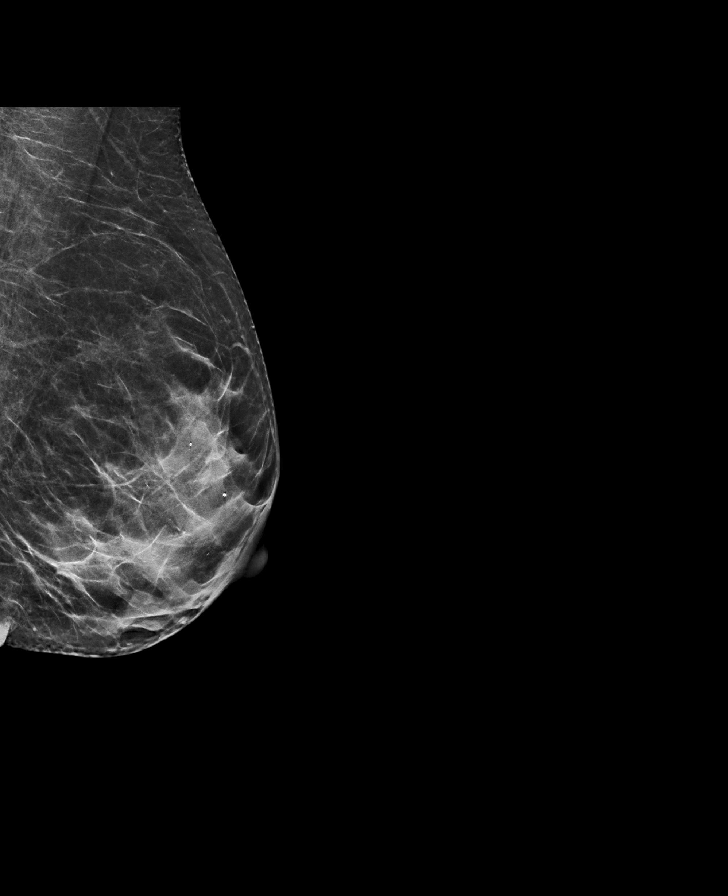

[L MLO tomo · tomo slice 30/59.0]
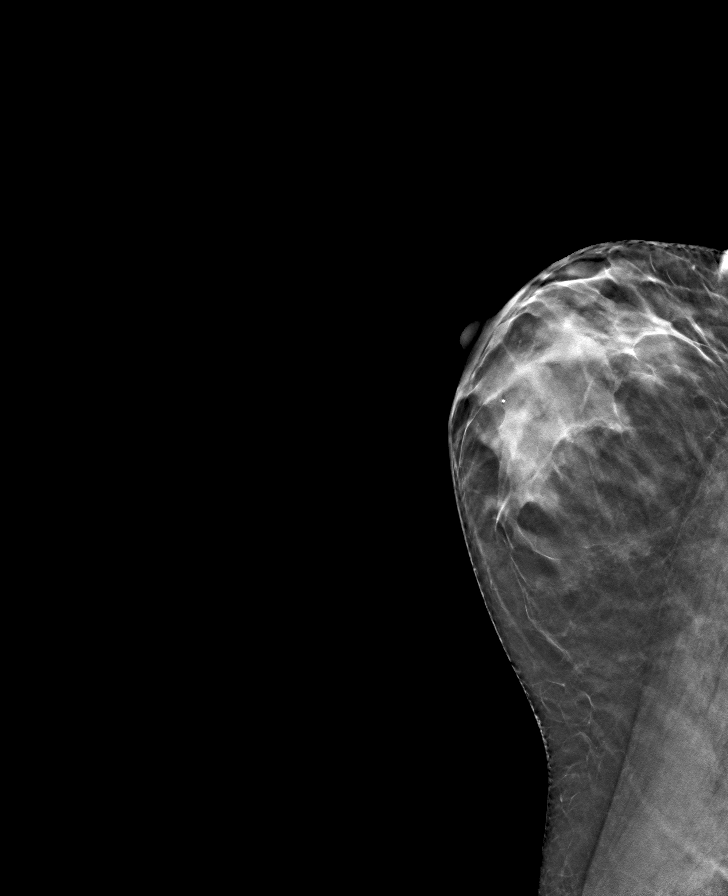

[L CC tomo · tomo slice 31/61.0]
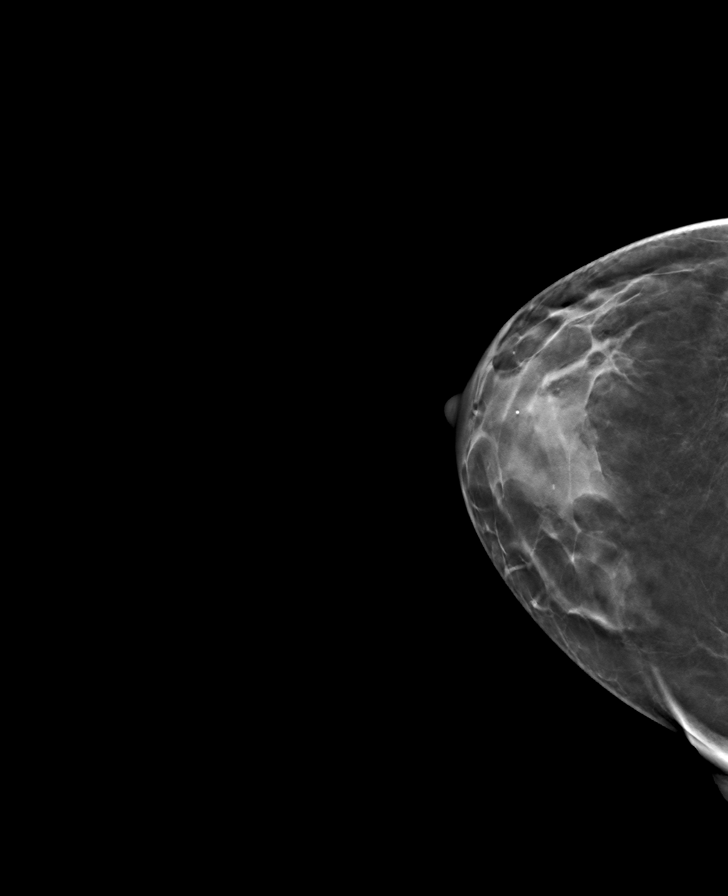

[R MLO tomo · tomo slice 29/58.0]
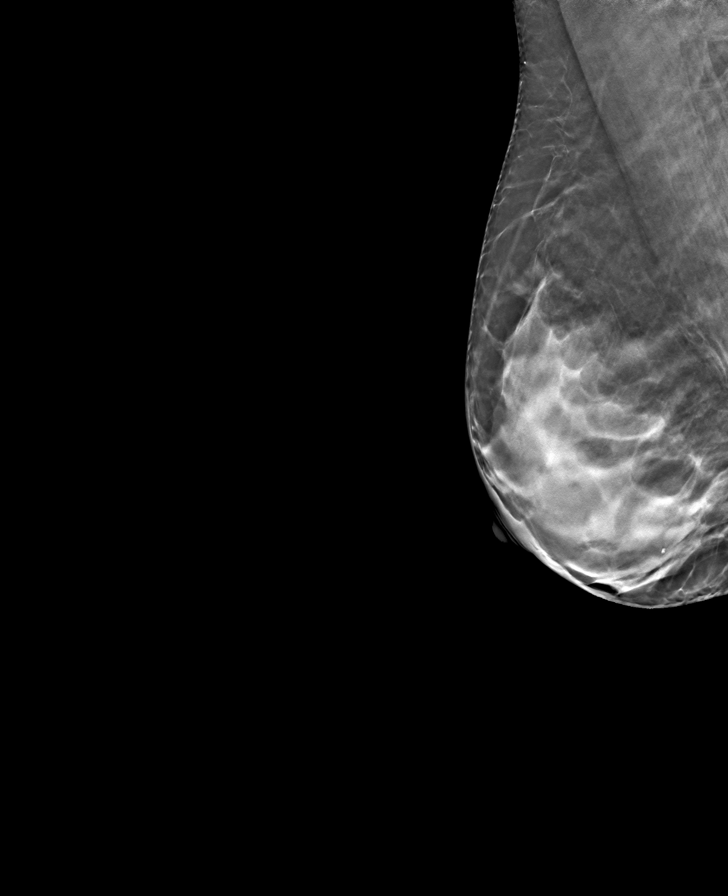

[R CC tomo · tomo slice 29/58.0]
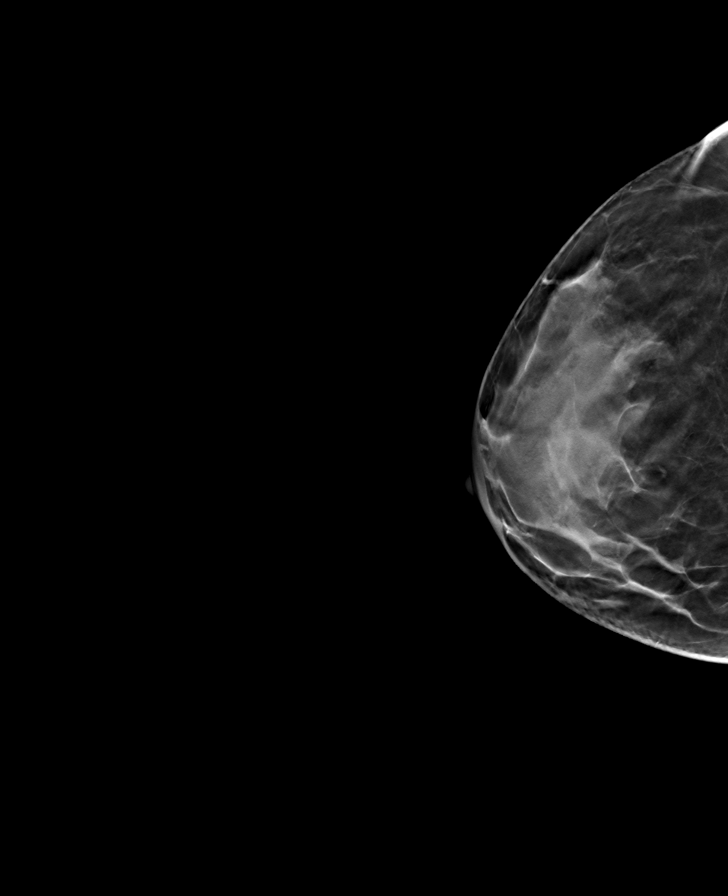

[8 of 24 positions shown; findings below may reference images not displayed]

ACR Breast Density Category d: The breast tissue is extremely dense,
which lowers the sensitivity of mammography
FINDINGS: There are no findings suspicious for malignancy.
IMPRESSION: No mammographic evidence of malignancy. A result letter of this
screening mammogram will be mailed directly to the patient.

RECOMMENDATION:
Screening mammogram in one year. (Code:TA-[id number])

BI-RADS CATEGORY  1: Negative.

## 2021-12-21 ENCOUNTER — Ambulatory Visit
Admission: RE | Admit: 2021-12-21 | Discharge: 2021-12-21 | Disposition: A | Discharge status: Home / Self Care | Payer: BC Managed Care – PPO | Source: Ambulatory Visit | Attending: Family Medicine | Admitting: Family Medicine

## 2021-12-21 DIAGNOSIS — Z1231 Encounter for screening mammogram for malignant neoplasm of breast: Secondary | ICD-10-CM | POA: Diagnosis not present

## 2022-01-09 ENCOUNTER — Other Ambulatory Visit: Payer: Self-pay | Admitting: Family Medicine

## 2022-01-09 DIAGNOSIS — B0229 Other postherpetic nervous system involvement: Secondary | ICD-10-CM

## 2022-01-10 NOTE — Telephone Encounter (Signed)
Refilled 03/12/2021 #360 1 rf. Requested Prescriptions  Pending Prescriptions Disp Refills  . gabapentin (NEURONTIN) 100 MG capsule [Pharmacy Med Name: GABAPENTIN 100MG  CAPSULES] 360 capsule 1    Sig: TAKE 2 CAPSULES(200 MG) BY MOUTH TWICE DAILY BEFORE LUNCH AND SUPPER     Neurology: Anticonvulsants - gabapentin Failed - 01/09/2022  3:32 AM      Failed - Cr in normal range and within 360 days    Creatinine, Ser  Date Value Ref Range Status  09/26/2019 0.75 0.57 - 1.00 mg/dL Final         Failed - Completed PHQ-2 or PHQ-9 in the last 360 days      Failed - Valid encounter within last 12 months    Recent Outpatient Visits          1 year ago Post herpetic neuralgia   [place], DO   1 year ago Acute non-recurrent frontal sinusitis   [place], DO   2 years ago Post herpetic neuralgia   [place], Devonne Doughty, DO   4 years ago Encounter for physical examination related to employment   [facility], Jerrel Ivory, NP   6 years ago Lumbar back pain with radiculopathy affecting left lower extremity   [place], Satira Anis, MD

## 2022-02-02 ENCOUNTER — Ambulatory Visit (INDEPENDENT_AMBULATORY_CARE_PROVIDER_SITE_OTHER): Payer: Self-pay | Admitting: Nurse Practitioner

## 2022-02-02 ENCOUNTER — Encounter: Payer: Self-pay | Admitting: Nurse Practitioner

## 2022-02-02 VITALS — BP 116/70 | HR 88 | Temp 98.0°F | Ht 64.0 in | Wt 143.8 lb

## 2022-02-02 DIAGNOSIS — J069 Acute upper respiratory infection, unspecified: Secondary | ICD-10-CM

## 2022-02-02 LAB — POC COVID19 BINAXNOW: SARS Coronavirus 2 Ag: NEGATIVE

## 2022-02-02 NOTE — Progress Notes (Signed)
[facility] [address] [place], [place] [address] [phone]  Office Visit Note  Patient Name: Yolanda Tran Date of Birth [id number]  Medical Record number [id number]  Date of Service: 02/02/2022  Chief Complaint  Patient presents with   Sore Throat    Started Sat night. Body achy, fatigue, ST off and on. Needs a COVID test to go back to work.      HPI 51 year old female started to feel sick 4 days ago.  Sore throat off and on  Congestion Body aches Denies a cough  She has been sneezing  Fatigue  Feels improvement today  She has been using Dayquil and Nyquil  Denies known fever    Current Medication:  Outpatient Encounter Medications as of 02/02/2022  Medication Sig   gabapentin (NEURONTIN) 100 MG capsule TAKE 2 CAPSULES(200 MG) BY MOUTH TWICE DAILY BEFORE LUNCH AND SUPPER   gabapentin (NEURONTIN) 300 MG capsule TAKE 1 CAPSULE(300 MG) BY MOUTH IN THE MORNING   benzonatate (TESSALON) 100 MG capsule Take 1 capsule (100 mg total) by mouth 2 (two) times daily as needed for cough. (Patient not taking: Reported on 02/02/2022)   cetirizine (ZYRTEC) 10 MG tablet Take 10 mg by mouth daily.   Cholecalciferol (VITAMIN D-3 PO) Take by mouth daily.   Cyanocobalamin (VITAMIN B-12 PO) Take by mouth daily.   FERROUS SULFATE PO Take by mouth daily.   fluticasone (FLONASE) 50 MCG/ACT nasal spray Place 2 sprays into both nostrils daily.   ipratropium (ATROVENT) 0.06 % nasal spray Place 2 sprays into both nostrils 4 (four) times daily. For up to 5-7 days then stop.   Multiple Vitamins-Minerals (MULTIVITAMIN WOMENS 50+ ADV PO) Take by mouth daily.   No facility-administered encounter medications on file as of 02/02/2022.      Medical History: Past Medical History:  Diagnosis Date   Alkaline phosphatase elevation    Allergy    Family history of adverse reaction to anesthesia    Mother - BP drops with propofol   History of shingles 03/28/2019   Forehead    Hyperlipidemia    Ovarian cyst    Post herpetic neuralgia    Thrombocytopenia (HCC)    Vitamin D deficiency disease      Vital Signs: BP 116/70 (BP Location: Left Arm, Patient Position: Sitting, Cuff Size: Normal)   Pulse 88   Temp 98 F (36.7 C) (Tympanic)   Ht 5\' 4"  (1.626 m)   Wt 143 lb 12.8 oz (65.2 kg)   SpO2 99%   BMI 24.68 kg/m    Review of Systems  Constitutional:  Positive for fatigue.  HENT:  Positive for congestion, postnasal drip and sore throat.   Respiratory: Negative.    Cardiovascular: Negative.   Genitourinary: Negative.   Musculoskeletal:  Positive for myalgias.  Neurological: Negative.     Physical Exam HENT:     Head: Normocephalic.     Nose: Congestion present.     Mouth/Throat:     Mouth: Mucous membranes are moist.     Pharynx: Posterior oropharyngeal erythema present.     Tonsils: No tonsillar exudate.  Cardiovascular:     Rate and Rhythm: Normal rate and regular rhythm.  Pulmonary:     Effort: Pulmonary effort is normal.     Breath sounds: Normal breath sounds.  Musculoskeletal:     Cervical back: Normal range of motion.  Skin:    General: Skin is warm.  Neurological:     Mental Status:  She is alert and oriented to person, place, and time.  Psychiatric:        Mood and Affect: Mood normal.     Recent Results (from the past 2160 hour(s))  POC COVID-19     Status: Normal   Collection Time: 02/02/22  8:54 AM  Result Value Ref Range   SARS Coronavirus 2 Ag Negative Negative     Assessment/Plan: 1. Viral upper respiratory tract infection  - POC COVID-19 negative    Continue to manage with over the counter medications as discussed Push fluids  Rest Encouraged a mask while symptomatic RTC with new or persistent symptoms as discussed   General Counseling: Yolanda Tran verbalizes understanding of the findings of todays visit and agrees with plan of treatment. I have discussed any further diagnostic evaluation that may be needed or  ordered today. We also reviewed her medications today. she has been encouraged to call the office with any questions or concerns that should arise related to todays visit.   Time spent:15 [facility] Family Nurse Practitioner

## 2022-02-21 ENCOUNTER — Other Ambulatory Visit: Payer: Self-pay | Admitting: Family Medicine

## 2022-02-21 DIAGNOSIS — B0229 Other postherpetic nervous system involvement: Secondary | ICD-10-CM

## 2022-02-21 NOTE — Telephone Encounter (Signed)
Requested medications are due for refill today.  unsure  Requested medications are on the active medications list.  yes  Last refill. 11/22/2021#90 0 rf  Future visit scheduled.   yes  Notes to clinic.  Pt has rx's for both 100 mg and 300 mg.    Requested Prescriptions  Pending Prescriptions Disp Refills   gabapentin (NEURONTIN) 300 MG capsule [Pharmacy Med Name: GABAPENTIN 300MG  CAPSULES] 90 capsule 0    Sig: TAKE 1 CAPSULE(300 MG) BY MOUTH IN THE MORNING     Neurology: Anticonvulsants - gabapentin Failed - 02/21/2022  3:30 AM      Failed - Cr in normal range and within 360 days    Creatinine, Ser  Date Value Ref Range Status  09/26/2019 0.75 0.57 - 1.00 mg/dL Final         Failed - Completed PHQ-2 or PHQ-9 in the last 360 days      Failed - Valid encounter within last 12 months    Recent Outpatient Visits           1 year ago Post herpetic neuralgia   [place], DO   1 year ago Acute non-recurrent frontal sinusitis   [place], DO   2 years ago Post herpetic neuralgia   [place], DO   4 years ago Encounter for physical examination related to employment   [facility] Puma, Jerrel Ivory, NP   6 years ago Lumbar back pain with radiculopathy affecting left lower extremity   [place], Satira Anis, MD       Future Appointments             Tomorrow Olin Hauser, DO [facility], [facility]

## 2022-02-22 ENCOUNTER — Ambulatory Visit: Payer: BC Managed Care – PPO | Admitting: Family Medicine

## 2022-02-22 ENCOUNTER — Encounter: Payer: Self-pay | Admitting: Family Medicine

## 2022-02-22 ENCOUNTER — Other Ambulatory Visit: Payer: Self-pay | Admitting: Family Medicine

## 2022-02-22 VITALS — BP 137/73 | HR 87 | Ht 64.0 in | Wt 143.0 lb

## 2022-02-22 DIAGNOSIS — B0229 Other postherpetic nervous system involvement: Secondary | ICD-10-CM | POA: Diagnosis not present

## 2022-02-22 DIAGNOSIS — Z0000 Encounter for general adult medical examination without abnormal findings: Secondary | ICD-10-CM

## 2022-02-22 DIAGNOSIS — E559 Vitamin D deficiency, unspecified: Secondary | ICD-10-CM

## 2022-02-22 DIAGNOSIS — E782 Mixed hyperlipidemia: Secondary | ICD-10-CM

## 2022-02-22 DIAGNOSIS — R7309 Other abnormal glucose: Secondary | ICD-10-CM

## 2022-02-22 MED ORDER — GABAPENTIN 300 MG PO CAPS: 300.0000 mg | ORAL_CAPSULE | Freq: Every day | ORAL | 3 refills | Status: DC | PRN

## 2022-02-22 MED ORDER — GABAPENTIN 100 MG PO CAPS: 100.0000 mg | ORAL_CAPSULE | Freq: Every day | ORAL | 3 refills | Status: DC | PRN

## 2022-02-22 NOTE — Patient Instructions (Addendum)
Thank you for coming to the office today.  Re ordered Gabapentin 100 and 300mg  use as needed for headaches Not categorized as migraines based on your symptoms I would agree it sounds like the shingles related residual headache nerve pain  DUE for FASTING BLOOD WORK (no food or drink after midnight before the lab appointment, only water or coffee without cream/sugar on the morning of)  SCHEDULE "Lab Only" visit in the morning at the clinic for lab draw in 6 MONTHS   - Make sure Lab Only appointment is at about 1 week before your next appointment, so that results will be available  For Lab Results, once available within 2-3 days of blood draw, you can can log in to MyChart online to view your results and a brief explanation. Also, we can discuss results at next follow-up visit.   Please schedule a Follow-up Appointment to: Return in about 6 months (around 08/23/2022) for 6 month fasting lab only then 1 week later Annual Physical.  If you have any other questions or concerns, please feel free to call the office or send a message through [place]. You may also schedule an earlier appointment if necessary.  Additionally, you may be receiving a survey about your experience at our office within a few days to 1 week by e-mail or mail. We value your feedback.  Nobie Putnam, DO [place]

## 2022-02-22 NOTE — Progress Notes (Signed)
Subjective:    Patient ID: Yolanda Tran, female    DOB: [birth year], 51 y.o.   MRN: [id number]  Yolanda Tran is a 51 y.o. female presenting on 02/22/2022 for Headache   HPI   Chronic Post Herpetic Neuralgia   Initial onset problem shingles acute shingles flare 03/2019 forehead mostly L sided. Significant flare initially, but no ophthalmic or other involvement. She has done well on gabapentin for that problem flare and then post herpetic neuralgia.  Now updated with Shingrix vaccine. She has completed the Gabapentin course, she was taking it more AS NEEDED at the end course and phased down and eventually came off of it.  She describes headaches as sharp pains like lightning bolt effect, similar to the shingles that she experienced, shooting pain. She suspects about 2 headache days of severe caliber back in the past 30 days. Dull headaches or mild headache maybe 3 days out of the month, overall has majority of headache free days per month. Not characteristic of migraines Improved on Gabapentin previously used 100-300mg  dosing AS NEEDED previously. Denies any vision change, hearing loss, new rash, other pain  Health Maintenance:  Completed Shingrix vaccine, will notify us later with updated dosing.  Updated Mammogram and Colonoscopy.     02/22/2022    4:06 PM 09/23/2020    1:38 PM 04/09/2019   12:02 PM  Depression screen PHQ 2/9  Decreased Interest 0 0 0  Down, Depressed, Hopeless 0 0 0  PHQ - 2 Score 0 0 0  Altered sleeping 0 0   Tired, decreased energy 0 0   Change in appetite 0 0   Feeling bad or failure about yourself  0 0   Trouble concentrating 0 0   Moving slowly or fidgety/restless 0 0   Suicidal thoughts 0 0   PHQ-9 Score 0 0   Difficult doing work/chores Not difficult at all Not difficult at all     Social History   Tobacco Use   Smoking status: Never   Smokeless tobacco: Never  Vaping Use   Vaping Use: Never used  Substance Use Topics    Alcohol use: Yes    Comment: 2-3 monthly    Drug use: No    Review of Systems Per HPI unless specifically indicated above     Objective:    BP 137/73   Pulse 87   Ht 5\' 4"  (1.626 m)   Wt 143 lb (64.9 kg)   SpO2 97%   BMI 24.55 kg/m   Wt Readings from Last 3 Encounters:  02/22/22 143 lb (64.9 kg)  02/02/22 143 lb 12.8 oz (65.2 kg)  09/23/20 145 lb 6.4 oz (66 kg)    Physical Exam Vitals and nursing note reviewed.  Constitutional:      General: She is not in acute distress.    Appearance: Normal appearance. She is well-developed. She is not diaphoretic.     Comments: Well-appearing, comfortable, cooperative  HENT:     Head: Normocephalic and atraumatic.  Eyes:     General:        Right eye: No discharge.        Left eye: No discharge.     Conjunctiva/sclera: Conjunctivae normal.  Cardiovascular:     Rate and Rhythm: Normal rate.  Pulmonary:     Effort: Pulmonary effort is normal.  Skin:    General: Skin is warm and dry.     Findings: No erythema or rash.  Neurological:     Mental  Status: She is alert and oriented to person, place, and time.  Psychiatric:        Mood and Affect: Mood normal.        Behavior: Behavior normal.        Thought Content: Thought content normal.     Comments: Well groomed, good eye contact, normal speech and thoughts    Results for orders placed or performed in visit on 02/02/22  POC COVID-19  Result Value Ref Range   SARS Coronavirus 2 Ag Negative Negative      Assessment & Plan:   Problem List Items Addressed This Visit   None Visit Diagnoses     Post herpetic neuralgia    -  Primary   Relevant Medications   gabapentin (NEURONTIN) 100 MG capsule   gabapentin (NEURONTIN) 300 MG capsule       Not categorized as migraines based on your symptoms I would agree it sounds like the shingles related residual headache nerve pain  Re ordered Gabapentin 100 and 300mg  use as needed for headaches Use 100mg  primarily, and can use  300mg  if severe headache or AS NEEDED discussed dosing. Max 3 times a day.  Meds ordered this encounter  Medications   gabapentin (NEURONTIN) 100 MG capsule    Sig: Take 1 capsule (100 mg total) by mouth daily as needed (headache).    Dispense:  90 capsule    Refill:  3   gabapentin (NEURONTIN) 300 MG capsule    Sig: Take 1 capsule (300 mg total) by mouth daily as needed (headache).    Dispense:  45 capsule    Refill:  3      Follow up plan: Return in about 6 months (around 08/23/2022) for 6 month fasting lab only then 1 week later Annual Physical.  Future labs 08/2022   Nobie Putnam, [facility] 02/22/2022, 4:17 PM

## 2022-03-21 ENCOUNTER — Encounter: Payer: Self-pay | Admitting: Physician Assistant

## 2022-03-21 ENCOUNTER — Ambulatory Visit (INDEPENDENT_AMBULATORY_CARE_PROVIDER_SITE_OTHER): Payer: Self-pay | Admitting: Physician Assistant

## 2022-03-21 VITALS — BP 120/80 | HR 84 | Temp 98.7°F | Ht 64.0 in | Wt 148.6 lb

## 2022-03-21 DIAGNOSIS — S63501A Unspecified sprain of right wrist, initial encounter: Secondary | ICD-10-CM

## 2022-03-21 NOTE — Progress Notes (Signed)
[facility] [address] [place], [place] [address]   Office Visit Note  Patient Name: Yolanda Tran Date of Birth [id number]  Medical Record number [id number]  Date of Service: 03/21/2022  Chief Complaint  Patient presents with   Hand Pain    Right hand. Middle finger down to wrist. Started really bothering hear couple weeks ago.      51 y/o F presents to the clinic for c/o c/o R wrist pain x 4 days. Pt is right handed. No fall or direct trauma to the wrist. She denies numbness or tingling sensation to her right fingers. She noticed bruising over the volar wrist last week and movement caused pain. However, bruising has resolved over the weekend and feels better today. Hasn't taken any otc medicine for this pain.    Hand Pain       Current Medication:  Outpatient Encounter Medications as of 03/21/2022  Medication Sig   cetirizine (ZYRTEC) 10 MG tablet Take 10 mg by mouth daily.   Cholecalciferol (VITAMIN D-3 PO) Take by mouth daily.   Cyanocobalamin (VITAMIN B-12 PO) Take by mouth daily.   FERROUS SULFATE PO Take by mouth daily.   gabapentin (NEURONTIN) 100 MG capsule Take 1 capsule (100 mg total) by mouth daily as needed (headache).   gabapentin (NEURONTIN) 300 MG capsule Take 1 capsule (300 mg total) by mouth daily as needed (headache).   Multiple Vitamins-Minerals (MULTIVITAMIN WOMENS 50+ ADV PO) Take by mouth daily.   fluticasone (FLONASE) 50 MCG/ACT nasal spray Place 2 sprays into both nostrils daily.   ipratropium (ATROVENT) 0.06 % nasal spray Place 2 sprays into both nostrils 4 (four) times daily. For up to 5-7 days then stop.   No facility-administered encounter medications on file as of 03/21/2022.      Medical History: Past Medical History:  Diagnosis Date   Alkaline phosphatase elevation    Allergy    Family history of adverse reaction to anesthesia    Mother - BP drops with propofol   History of shingles 03/28/2019   Forehead    Hyperlipidemia    Ovarian cyst    Post herpetic neuralgia    Thrombocytopenia (HCC)    Vitamin D deficiency disease      Vital Signs: BP 120/80 (BP Location: Left Arm, Patient Position: Sitting, Cuff Size: Normal)   Pulse 84   Temp 98.7 F (37.1 C) (Tympanic)   Ht 5\' 4"  (1.626 m)   Wt 148 lb 9.6 oz (67.4 kg)   SpO2 98%   BMI 25.51 kg/m    Review of Systems  Constitutional: Negative.   Musculoskeletal:        R wrist pain  Skin: Negative.   Neurological: Negative.     Physical Exam Constitutional:      Appearance: Normal appearance.  HENT:     Head: Normocephalic and atraumatic.     Right Ear: External ear normal.     Left Ear: External ear normal.  Eyes:     Extraocular Movements: Extraocular movements intact.  Musculoskeletal:     Comments: R wrist: Full ROM. No bony tenderness. No ecchymosis identified. Sensation is grossly normal. Normal grasping strength. Negative Finkelstein's test. Negative Tinel's sign. Negative Phalen's test.   Skin:    General: Skin is warm and dry.  Neurological:     Mental Status: She is alert and oriented to person, place, and time.  Psychiatric:        Mood and Affect: Mood normal.  Behavior: Behavior normal.       Assessment/Plan:  1. Sprain of right wrist, initial encounter  Decrease use of R hand.  No isolated lifting with R hand only. Perform range of motion exercises and stretches daily. Take frequent breaks from typing.  Consider use of wrist splint without thumb spica. Take otc Ibuprofen or Tylenol for pain if needed. RTC in 2 weeks for re-evaluation. Pt verbalized understanding and in agreement.   General Counseling: Yolanda Tran verbalizes understanding of the findings of todays visit and agrees with plan of treatment. I have discussed any further diagnostic evaluation that may be needed or ordered today. We also reviewed her medications today. she has been encouraged to call the office with any questions or  concerns that should arise related to todays visit.    Time spent:20 Minutes    Gilberto Better, [place] Physician Assistant

## 2022-04-04 ENCOUNTER — Encounter: Payer: Self-pay | Admitting: Family Medicine

## 2022-04-04 DIAGNOSIS — B0229 Other postherpetic nervous system involvement: Secondary | ICD-10-CM

## 2022-04-07 MED ORDER — GABAPENTIN 100 MG PO CAPS: 100.0000 mg | ORAL_CAPSULE | Freq: Every day | ORAL | 3 refills | Status: DC | PRN

## 2022-04-07 NOTE — Addendum Note (Signed)
Addended by: Smitty Cords on: 04/07/2022 05:31 PM   Modules accepted: Orders

## 2022-04-20 ENCOUNTER — Encounter: Payer: Self-pay | Admitting: Family Medicine

## 2022-06-24 ENCOUNTER — Encounter: Payer: Self-pay | Admitting: Family Medicine

## 2022-07-02 ENCOUNTER — Other Ambulatory Visit: Payer: Self-pay | Admitting: Family Medicine

## 2022-07-02 DIAGNOSIS — B0229 Other postherpetic nervous system involvement: Secondary | ICD-10-CM

## 2022-07-04 NOTE — Telephone Encounter (Signed)
Requested Prescriptions  Pending Prescriptions Disp Refills   gabapentin (NEURONTIN) 300 MG capsule [Pharmacy Med Name: GABAPENTIN 300MG  CAPSULES] 45 capsule 3    Sig: TAKE 1 CAPSULE(300 MG) BY MOUTH DAILY AS NEEDED FOR HEADACHE     Neurology: Anticonvulsants - gabapentin Failed - 07/02/2022  1:25 PM      Failed - Cr in normal range and within 360 days    Creatinine, Ser  Date Value Ref Range Status  09/26/2019 0.75 0.57 - 1.00 mg/dL Final         Passed - Completed PHQ-2 or PHQ-9 in the last 360 days      Passed - Valid encounter within last 12 months    Recent Outpatient Visits           4 months ago Post herpetic neuralgia   [place], DO   1 year ago Post herpetic neuralgia   [place], DO   2 years ago Acute non-recurrent frontal sinusitis   [place], DO   3 years ago Post herpetic neuralgia   [place], DO   4 years ago Encounter for physical examination related to employment   [facility] Merrilyn Puma, Jerrel Ivory, NP       Future Appointments             In 1 month [place], Devonne Doughty, [facility], [facility]

## 2022-07-06 MED ORDER — GABAPENTIN 300 MG PO CAPS: 300.0000 mg | ORAL_CAPSULE | Freq: Every day | ORAL | 3 refills | Status: DC

## 2022-07-06 MED ORDER — GABAPENTIN 100 MG PO CAPS: 100.0000 mg | ORAL_CAPSULE | Freq: Three times a day (TID) | ORAL | 3 refills | Status: DC

## 2022-07-06 NOTE — Addendum Note (Signed)
Addended by: Olin Hauser on: 07/06/2022 05:50 PM   Modules accepted: Orders

## 2022-08-23 ENCOUNTER — Other Ambulatory Visit: Payer: BC Managed Care – PPO

## 2022-08-23 DIAGNOSIS — Z0000 Encounter for general adult medical examination without abnormal findings: Secondary | ICD-10-CM

## 2022-08-23 DIAGNOSIS — E782 Mixed hyperlipidemia: Secondary | ICD-10-CM

## 2022-08-23 DIAGNOSIS — E559 Vitamin D deficiency, unspecified: Secondary | ICD-10-CM

## 2022-08-23 DIAGNOSIS — R7309 Other abnormal glucose: Secondary | ICD-10-CM

## 2022-08-23 LAB — CBC WITH DIFFERENTIAL/PLATELET
Basophils Absolute: 20 cells/uL (ref 0–200)
Eosinophils Absolute: 90 cells/uL (ref 15–500)
Eosinophils Relative: 1.8 %
Hemoglobin: 12.7 g/dL (ref 11.7–15.5)
Lymphs Abs: 1355 cells/uL (ref 850–3900)
MCH: 28.5 pg (ref 27.0–33.0)
MPV: 13.8 fL — ABNORMAL HIGH (ref 7.5–12.5)
RBC: 4.45 10*6/uL (ref 3.80–5.10)
RDW: 12.9 % (ref 11.0–15.0)
Total Lymphocyte: 27.1 %

## 2022-08-24 LAB — COMPLETE METABOLIC PANEL WITH GFR
AG Ratio: 1.7 (calc) (ref 1.0–2.5)
ALT: 11 U/L (ref 6–29)
AST: 15 U/L (ref 10–35)
Albumin: 4 g/dL (ref 3.6–5.1)
Alkaline phosphatase (APISO): 94 U/L (ref 37–153)
BUN: 11 mg/dL (ref 7–25)
CO2: 24 mmol/L (ref 20–32)
Calcium: 8.9 mg/dL (ref 8.6–10.4)
Chloride: 108 mmol/L (ref 98–110)
Creat: 0.63 mg/dL (ref 0.50–1.03)
Globulin: 2.3 g/dL (calc) (ref 1.9–3.7)
Glucose, Bld: 80 mg/dL (ref 65–99)
Potassium: 3.6 mmol/L (ref 3.5–5.3)
Sodium: 141 mmol/L (ref 135–146)
Total Bilirubin: 0.7 mg/dL (ref 0.2–1.2)
Total Protein: 6.3 g/dL (ref 6.1–8.1)
eGFR: 106 mL/min/{1.73_m2} (ref >=60)

## 2022-08-24 LAB — LIPID PANEL
Cholesterol: 226 mg/dL — ABNORMAL HIGH (ref <200)
HDL: 56 mg/dL (ref >=50)
LDL Cholesterol (Calc): 147 mg/dL (calc) — ABNORMAL HIGH
Non-HDL Cholesterol (Calc): 170 mg/dL (calc) — ABNORMAL HIGH (ref <130)
Total CHOL/HDL Ratio: 4 (calc) (ref <5.0)
Triglycerides: 114 mg/dL (ref <150)

## 2022-08-24 LAB — CBC WITH DIFFERENTIAL/PLATELET
Absolute Monocytes: 450 cells/uL (ref 200–950)
Basophils Relative: 0.4 %
HCT: 39.6 % (ref 35.0–45.0)
MCHC: 32.1 g/dL (ref 32.0–36.0)
MCV: 89 fL (ref 80.0–100.0)
Monocytes Relative: 9 %
Neutro Abs: 3085 cells/uL (ref 1500–7800)
Neutrophils Relative %: 61.7 %
Platelets: 109 10*3/uL — ABNORMAL LOW (ref 140–400)
WBC: 5 10*3/uL (ref 3.8–10.8)

## 2022-08-24 LAB — TSH: TSH: 1.7 mIU/L

## 2022-08-24 LAB — VITAMIN D 25 HYDROXY (VIT D DEFICIENCY, FRACTURES): Vit D, 25-Hydroxy: 31 ng/mL (ref 30–100)

## 2022-08-24 LAB — HEMOGLOBIN A1C
Hgb A1c MFr Bld: 5.3 % of total Hgb (ref <5.7)
Mean Plasma Glucose: 105 mg/dL
eAG (mmol/L): 5.8 mmol/L

## 2022-08-30 ENCOUNTER — Ambulatory Visit (INDEPENDENT_AMBULATORY_CARE_PROVIDER_SITE_OTHER): Payer: BC Managed Care – PPO | Admitting: Family Medicine

## 2022-08-30 ENCOUNTER — Encounter: Payer: Self-pay | Admitting: Family Medicine

## 2022-08-30 VITALS — BP 118/80 | HR 73 | Ht 64.0 in | Wt 149.0 lb

## 2022-08-30 DIAGNOSIS — D696 Thrombocytopenia, unspecified: Secondary | ICD-10-CM

## 2022-08-30 DIAGNOSIS — E7800 Pure hypercholesterolemia, unspecified: Secondary | ICD-10-CM | POA: Diagnosis not present

## 2022-08-30 DIAGNOSIS — Z1231 Encounter for screening mammogram for malignant neoplasm of breast: Secondary | ICD-10-CM | POA: Diagnosis not present

## 2022-08-30 DIAGNOSIS — Z0000 Encounter for general adult medical examination without abnormal findings: Secondary | ICD-10-CM | POA: Diagnosis not present

## 2022-08-30 DIAGNOSIS — B0229 Other postherpetic nervous system involvement: Secondary | ICD-10-CM

## 2022-08-30 NOTE — Progress Notes (Unsigned)
Subjective:    Patient ID: Yolanda Tran, female    DOB: 1970/11/15, 52 y.o.   MRN: [id number]  [name] is a 52 y.o. female presenting on 08/30/2022 for Annual Exam   HPI  Here for Annual Physical and Lab Review  Lab review A1c 5.3  HYPERLIPIDEMIA: - Reports concern with elevated cholesterol. Last lipid panel 08/2022, mild elevated LDL 147 - Not on statin therapy Past treatment but prefers to avoid  Thrombocytopenia Last lab 109 PLT, mild low, admits easy bruising but no significant complications. She has prior lab 7 yr ago PLT 126.  Vitamin D Currently VItamin D 31  Not taking Multivitamin  Post Herpetic Neuralgia She is doing well on Gabapentin 300mg  AM and 200-300mg  PM AS NEEDED   Last menstrual Oct 2023, next cycle in April 2024. perimenopausal  Health Maintenance:  Shingrix vaccine, completed, request copy of last 2 doses.  Due for cervical cancer screen pap smear per GYN, last done 2019, request copy and her to follow up  Mammogram due 12/2022, yearly check, last negative.      08/30/2022    4:16 PM 02/22/2022    4:06 PM 09/23/2020    1:38 PM  Depression screen PHQ 2/9  Decreased Interest 0 0 0  Down, Depressed, Hopeless 0 0 0  PHQ - 2 Score 0 0 0  Altered sleeping  0 0  Tired, decreased energy  0 0  Change in appetite  0 0  Feeling bad or failure about yourself   0 0  Trouble concentrating  0 0  Moving slowly or fidgety/restless  0 0  Suicidal thoughts  0 0  PHQ-9 Score  0 0  Difficult doing work/chores  Not difficult at all Not difficult at all    Past Medical History:  Diagnosis Date   Alkaline phosphatase elevation    Allergy    Family history of adverse reaction to anesthesia    Mother - BP drops with propofol   History of shingles 03/28/2019   Forehead   Hyperlipidemia    Ovarian cyst    Post herpetic neuralgia    Thrombocytopenia (HCC)    Vitamin D deficiency disease    Past Surgical History:  Procedure  Laterality Date   BREAST SURGERY  2003   tissue removed from breast   COLONOSCOPY WITH PROPOFOL N/A 11/12/2019   Procedure: COLONOSCOPY WITH PROPOFOL;  Surgeon: Pasty Spillers, MD;  Location[facility];  Service: Endoscopy;  Laterality: N/A;   TUBAL LIGATION  2002   Social History   Socioeconomic History   Marital status: Married    Spouse name: Not on file   Number of children: 2   Years of education: Not on file   Highest education level: Bachelor's degree (e.g., BA, AB, BS)  Occupational History   Not on file  Tobacco Use   Smoking status: Never   Smokeless tobacco: Never  Vaping Use   Vaping Use: Never used  Substance and Sexual Activity   Alcohol use: Yes    Comment: 2-3 monthly    Drug use: No   Sexual activity: Yes    Birth control/protection: None, Surgical    Comment: tubal ligation  Other Topics Concern   Not on file  Social History Narrative   Not on file   Social Determinants of Health   Financial Resource Strain: Low Risk  (01/03/2018)   Overall Financial Resource Strain (CARDIA)    Difficulty of Paying Living Expenses: Not  very hard  Food Insecurity: No Food Insecurity (01/03/2018)   Hunger Vital Sign    Worried About Running Out of Food in the Last Year: Never true    Ran Out of Food in the Last Year: Never true  Transportation Needs: No Transportation Needs (01/03/2018)   PRAPARE - Administrator, Civil Service (Medical): No    Lack of Transportation (Non-Medical): No  Physical Activity: Insufficiently Active (01/03/2018)   Exercise Vital Sign    Days of Exercise per Week: 1 day    Minutes of Exercise per Session: 30 min  Stress: No Stress Concern Present (01/03/2018)   [employer] of Occupational Health - Occupational Stress Questionnaire    Feeling of Stress : Not at all  Social Connections: Unknown (01/03/2018)   Social Connection and Isolation Panel [NHANES]    Frequency of Communication with Friends and Family: Not  on file    Frequency of Social Gatherings with Friends and Family: Not on file    Attends Religious Services: More than 4 times per year    Active Member of [employer] or Organizations: Yes    Attends Banker Meetings: More than 4 times per year    Marital Status: Married  Catering manager Violence: Not At Risk (01/03/2018)   Humiliation, Afraid, Rape, and Kick questionnaire    Fear of Current or Ex-Partner: No    Emotionally Abused: No    Physically Abused: No    Sexually Abused: No   Family History  Problem Relation Age of Onset   Stroke Mother 58   Hypertension Mother    Heart disease Mother    Depression Mother    Diabetes Father    Heart disease Father    Congestive Heart Failure Father    Cancer Father        bone   Diabetes Paternal Grandmother    Breast cancer Maternal Aunt 60       has contact   Breast cancer Cousin        mid 20s, has contact   Colon cancer Maternal Aunt 40   Healthy Daughter    Healthy Son    Stroke Maternal Grandmother    Heart disease Maternal Grandfather    COPD Neg Hx    Current Outpatient Medications on File Prior to Visit  Medication Sig   cetirizine (ZYRTEC) 10 MG tablet Take 10 mg by mouth daily.   Cholecalciferol (VITAMIN D-3 PO) Take by mouth daily.   Cyanocobalamin (VITAMIN B-12 PO) Take by mouth daily.   FERROUS SULFATE PO Take by mouth daily.   fluticasone (FLONASE) 50 MCG/ACT nasal spray Place 2 sprays into both nostrils daily.   gabapentin (NEURONTIN) 100 MG capsule Take 1 capsule (100 mg total) by mouth 3 (three) times daily. Note also taking Gabapentin 300mg  once daily   gabapentin (NEURONTIN) 300 MG capsule Take 1 capsule (300 mg total) by mouth daily. Note also taking Gabapentin 100mg  3 times daily   Multiple Vitamins-Minerals (MULTIVITAMIN WOMENS 50+ ADV PO) Take by mouth daily.   No current facility-administered medications on file prior to visit.    Review of Systems  Constitutional:  Negative for activity  change, appetite change, chills, diaphoresis, fatigue and fever.  HENT:  Negative for congestion and hearing loss.   Eyes:  Negative for visual disturbance.  Respiratory:  Negative for cough, chest tightness, shortness of breath and wheezing.   Cardiovascular:  Negative for chest pain, palpitations and leg swelling.  Gastrointestinal:  Negative  for abdominal pain, constipation, diarrhea, nausea and vomiting.  Genitourinary:  Negative for dysuria, frequency and hematuria.  Musculoskeletal:  Negative for arthralgias and neck pain.  Skin:  Negative for rash.  Neurological:  Negative for dizziness, weakness, light-headedness, numbness and headaches.  Hematological:  Negative for adenopathy.  Psychiatric/Behavioral:  Negative for behavioral problems, dysphoric mood and sleep disturbance.    Per HPI unless specifically indicated above      Objective:    BP 118/80   Pulse 73   Ht 5\' 4"  (1.626 m)   Wt 149 lb (67.6 kg)   SpO2 99%   BMI 25.58 kg/m   Wt Readings from Last 3 Encounters:  08/30/22 149 lb (67.6 kg)  03/21/22 148 lb 9.6 oz (67.4 kg)  02/22/22 143 lb (64.9 kg)    Physical Exam Vitals and nursing note reviewed.  Constitutional:      General: She is not in acute distress.    Appearance: She is well-developed. She is not diaphoretic.     Comments: Well-appearing, comfortable, cooperative  HENT:     Head: Normocephalic and atraumatic.     Right Ear: Tympanic membrane, ear canal and external ear normal. There is no impacted cerumen.     Left Ear: Tympanic membrane, ear canal and external ear normal. There is no impacted cerumen.  Eyes:     General:        Right eye: No discharge.        Left eye: No discharge.     Conjunctiva/sclera: Conjunctivae normal.     Pupils: Pupils are equal, round, and reactive to light.  Neck:     Thyroid: No thyromegaly.     Vascular: No carotid bruit.  Cardiovascular:     Rate and Rhythm: Normal rate and regular rhythm.     Pulses: Normal  pulses.     Heart sounds: Normal heart sounds. No murmur heard. Pulmonary:     Effort: Pulmonary effort is normal. No respiratory distress.     Breath sounds: Normal breath sounds. No wheezing or rales.  Abdominal:     General: Bowel sounds are normal. There is no distension.     Palpations: Abdomen is soft. There is no mass.     Tenderness: There is no abdominal tenderness.  Musculoskeletal:        General: No tenderness. Normal range of motion.     Cervical back: Normal range of motion and neck supple.     Right lower leg: No edema.     Left lower leg: No edema.     Comments: Upper / Lower Extremities: - Normal muscle tone, strength bilateral upper extremities 5/5, lower extremities 5/5  Lymphadenopathy:     Cervical: No cervical adenopathy.  Skin:    General: Skin is warm and dry.     Findings: No erythema or rash.  Neurological:     Mental Status: She is alert and oriented to person, place, and time.     Comments: Distal sensation intact to light touch all extremities  Psychiatric:        Mood and Affect: Mood normal.        Behavior: Behavior normal.        Thought Content: Thought content normal.     Comments: Well groomed, good eye contact, normal speech and thoughts       Results for orders placed or performed in visit on 08/23/22  VITAMIN D 25 Hydroxy (Vit-D Deficiency, Fractures)  Result Value Ref Range  Vit D, 25-Hydroxy 31 30 - 100 ng/mL  TSH  Result Value Ref Range   TSH 1.70 mIU/L  Hemoglobin A1c  Result Value Ref Range   Hgb A1c MFr Bld 5.3 <5.7 % of total Hgb   Mean Plasma Glucose 105 mg/dL   eAG (mmol/L) 5.8 mmol/L  Lipid panel  Result Value Ref Range   Cholesterol 226 (H) <200 mg/dL   HDL 56 > OR = 50 mg/dL   Triglycerides 161 <096 mg/dL   LDL Cholesterol (Calc) 147 (H) mg/dL (calc)   Total CHOL/HDL Ratio 4.0 <5.0 (calc)   Non-HDL Cholesterol (Calc) 170 (H) <130 mg/dL (calc)  CBC with Differential/Platelet  Result Value Ref Range   WBC 5.0  3.8 - 10.8 Thousand/uL   RBC 4.45 3.80 - 5.10 Million/uL   Hemoglobin 12.7 11.7 - 15.5 g/dL   HCT 04.5 40.9 - 81.1 %   MCV 89.0 80.0 - 100.0 fL   MCH 28.5 27.0 - 33.0 pg   MCHC 32.1 32.0 - 36.0 g/dL   RDW 91.4 78.2 - 95.6 %   Platelets 109 (L) 140 - 400 Thousand/uL   MPV 13.8 (H) 7.5 - 12.5 fL   Neutro Abs 3,085 1,500 - 7,800 cells/uL   Lymphs Abs 1,355 850 - 3,900 cells/uL   Absolute Monocytes 450 200 - 950 cells/uL   Eosinophils Absolute 90 15 - 500 cells/uL   Basophils Absolute 20 0 - 200 cells/uL   Neutrophils Relative % 61.7 %   Total Lymphocyte 27.1 %   Monocytes Relative 9.0 %   Eosinophils Relative 1.8 %   Basophils Relative 0.4 %  COMPLETE METABOLIC PANEL WITH GFR  Result Value Ref Range   Glucose, Bld 80 65 - 99 mg/dL   BUN 11 7 - 25 mg/dL   Creat 2.13 0.86 - 5.78 mg/dL   eGFR 469 > OR = 60 GE/XBM/8.41L2   BUN/Creatinine Ratio SEE NOTE: 6 - 22 (calc)   Sodium 141 135 - 146 mmol/L   Potassium 3.6 3.5 - 5.3 mmol/L   Chloride 108 98 - 110 mmol/L   CO2 24 20 - 32 mmol/L   Calcium 8.9 8.6 - 10.4 mg/dL   Total Protein 6.3 6.1 - 8.1 g/dL   Albumin 4.0 3.6 - 5.1 g/dL   Globulin 2.3 1.9 - 3.7 g/dL (calc)   AG Ratio 1.7 1.0 - 2.5 (calc)   Total Bilirubin 0.7 0.2 - 1.2 mg/dL   Alkaline phosphatase (APISO) 94 37 - 153 U/L   AST 15 10 - 35 U/L   ALT 11 6 - 29 U/L      Assessment & Plan:   Problem List Items Addressed This Visit     Hyperlipidemia   Post herpetic neuralgia    S/p shingles flare with residual neuropathic symptoms Stable on Gabapentin management Currently on Gabapentin 300mg  daily and 200-300mg  PM PRN      Thrombocytopenia (HCC)    Stable chronic problem, mild low PLT No symptoms or concerns. Monitor yearly      Other Visit Diagnoses     Annual physical exam    -  Primary   Encounter for screening mammogram for malignant neoplasm of breast       Relevant Orders   MM 3D SCREENING MAMMOGRAM BILATERAL BREAST       Updated Health Maintenance  information Reviewed recent lab results with patient Encouraged improvement to lifestyle with diet and exercise Heart healthy diet Goal of weight loss  Perimenopausal - cycle q 6-8  months approx, she will f/u with GYN. Anticipate wait 12 months no cycle prior to diagnosis.  She will call to schedule Mammogram  F/u with OBGYN for updated Pap Smear  Orders Placed This Encounter  Procedures   MM 3D SCREENING MAMMOGRAM BILATERAL BREAST    Standing Status:   Future    Standing Expiration Date:   08/30/2023    Order Specific Question:   Reason for Exam (SYMPTOM  OR DIAGNOSIS REQUIRED)    Answer:   Screening bilateral 3D Mammogram Tomo    Order Specific Question:   Preferred imaging location?    Answer:   [facility]     No orders of the defined types were placed in this encounter.     Follow up plan: Return in about 1 year (around 08/30/2023) for 1 year fasting lab only then 1 week later Annual Physical.  Future labs 08/31/23  Saralyn Pilar, DO [facility] 08/30/2022, 4:31 PM

## 2022-08-30 NOTE — Patient Instructions (Addendum)
Thank you for coming to the office today.  For Mammogram screening for breast cancer   Call the Imaging Center below anytime to schedule your own appointment now that order has been placed.  [facility] at [facility] [address], [address] [place], [place] [address] Phone: [phone]  --------  Recommend follow up with Althea Grimmer PA at [facility] - sometime later this year, I believe you may be due for Pap Smear, according to our record last was 2019, should be every 5 years.   Keep improving lifestyle diet low cholesterol heart healthy diet.  DUE for FASTING BLOOD WORK (no food or drink after midnight before the lab appointment, only water or coffee without cream/sugar on the morning of)  SCHEDULE "Lab Only" visit in the morning at the clinic for lab draw in 1 YEAR  - Make sure Lab Only appointment is at about 1 week before your next appointment, so that results will be available  For Lab Results, once available within 2-3 days of blood draw, you can can log in to MyChart online to view your results and a brief explanation. Also, we can discuss results at next follow-up visit.     Please schedule a Follow-up Appointment to: Return in about 1 year (around 08/30/2023) for 1 year fasting lab only then 1 week later Annual Physical.  If you have any other questions or concerns, please feel free to call the office or send a message through MyChart. You may also schedule an earlier appointment if necessary.  Additionally, you may be receiving a survey about your experience at our office within a few days to 1 week by e-mail or mail. We value your feedback.  Saralyn Pilar, DO [facility], [place]

## 2022-08-31 ENCOUNTER — Other Ambulatory Visit: Payer: Self-pay | Admitting: Family Medicine

## 2022-08-31 DIAGNOSIS — E782 Mixed hyperlipidemia: Secondary | ICD-10-CM

## 2022-08-31 DIAGNOSIS — D696 Thrombocytopenia, unspecified: Secondary | ICD-10-CM

## 2022-08-31 DIAGNOSIS — E7800 Pure hypercholesterolemia, unspecified: Secondary | ICD-10-CM

## 2022-08-31 DIAGNOSIS — Z0000 Encounter for general adult medical examination without abnormal findings: Secondary | ICD-10-CM

## 2022-08-31 DIAGNOSIS — R7309 Other abnormal glucose: Secondary | ICD-10-CM

## 2022-08-31 DIAGNOSIS — E559 Vitamin D deficiency, unspecified: Secondary | ICD-10-CM

## 2022-08-31 DIAGNOSIS — B0229 Other postherpetic nervous system involvement: Secondary | ICD-10-CM | POA: Insufficient documentation

## 2022-08-31 NOTE — Assessment & Plan Note (Signed)
Stable chronic problem, mild low PLT No symptoms or concerns. Monitor yearly

## 2022-08-31 NOTE — Assessment & Plan Note (Signed)
S/p shingles flare with residual neuropathic symptoms Stable on Gabapentin management Currently on Gabapentin 300mg  daily and 200-300mg  PM PRN

## 2022-09-02 ENCOUNTER — Encounter: Payer: Self-pay | Admitting: Family Medicine

## 2022-10-19 ENCOUNTER — Encounter: Payer: Self-pay | Admitting: Family Medicine

## 2022-10-19 NOTE — Telephone Encounter (Signed)
That's fine

## 2022-10-20 NOTE — Progress Notes (Addendum)
PCP:  Smitty Cords, DO   Chief Complaint  Patient presents with   Gynecologic Exam    No concerns     HPI:      Ms. Yolanda Tran is a 52 y.o. [phone] who LMP was Patient's last menstrual period was 09/13/2022 (exact date)., presents today for her annual examination.  Her menses are infrequent now due to perimenopause, lasting 7 days. Skipped from 11/23-5/24. Dysmenorrhea mild, no BTB. Now having vasomotor sx.  Sex activity: single partner, contraception - tubal ligation. Uses lubricants for vaginal dryness. Last Pap: 06/06/17 Results were: no abnormalities /neg HPV DNA  Hx of STDs: none  Last mammogram: 12/21/21 Results were no abnormalities; repeat in 12 months. Has appt 9/24 There is a FH of breast cancer in her 2 mat aunts and mat 2nd cousin, genetic testing not done. There is no FH of ovarian cancer. The patient does self-breast exams.  Tobacco use: The patient denies current or previous tobacco use. Alcohol use: none No drug use.  Exercise: min active  Colonoscopy: 7/21 with [facility]; repeat due after 10 yrs  She does get adequate calcium and occas Vitamin D in her diet.  Labs with PCP  Past Medical History:  Diagnosis Date   Alkaline phosphatase elevation    Allergy    Family history of adverse reaction to anesthesia    Mother - BP drops with propofol   History of shingles 03/28/2019   Forehead   Hyperlipidemia    Ovarian cyst    Post herpetic neuralgia    Thrombocytopenia (HCC)    Vitamin D deficiency disease     Past Surgical History:  Procedure Laterality Date   BREAST SURGERY  2003   tissue removed from breast   COLONOSCOPY WITH PROPOFOL N/A 11/12/2019   Procedure: COLONOSCOPY WITH PROPOFOL;  Surgeon: Pasty Spillers, MD;  Location: [facility];  Service: Endoscopy;  Laterality: N/A;   TUBAL LIGATION  2002    Family History  Problem Relation Age of Onset   Stroke Mother 27   Hypertension Mother    Heart disease  Mother    Depression Mother    Diabetes Father    Heart disease Father    Congestive Heart Failure Father    Cancer Father        bone   Stroke Maternal Grandmother    Heart disease Maternal Grandfather    Diabetes Paternal Grandmother    Healthy Daughter    Healthy Son    Breast cancer Maternal Aunt        40s/50s   Colon cancer Maternal Aunt 60   Breast cancer Maternal Aunt        40s/50s   Breast cancer Cousin        mid 32s, has contact   COPD Neg Hx     Social History   Socioeconomic History   Marital status: Married    Spouse name: Not on file   Number of children: 2   Years of education: Not on file   Highest education level: Bachelor's degree (e.g., BA, AB, BS)  Occupational History   Not on file  Tobacco Use   Smoking status: Never   Smokeless tobacco: Never  Vaping Use   Vaping Use: Never used  Substance and Sexual Activity   Alcohol use: Yes    Comment: 2-3 monthly    Drug use: No   Sexual activity: Yes    Birth control/protection: None, Surgical  Comment: tubal ligation  Other Topics Concern   Not on file  Social History Narrative   Not on file   Social Determinants of Health   Financial Resource Strain: Low Risk  (01/03/2018)   Overall Financial Resource Strain (CARDIA)    Difficulty of Paying Living Expenses: Not very hard  Food Insecurity: No Food Insecurity (01/03/2018)   Hunger Vital Sign    Worried About Running Out of Food in the Last Year: Never true    Ran Out of Food in the Last Year: Never true  Transportation Needs: No Transportation Needs (01/03/2018)   PRAPARE - Administrator, Civil Service (Medical): No    Lack of Transportation (Non-Medical): No  Physical Activity: Insufficiently Active (01/03/2018)   Exercise Vital Sign    Days of Exercise per Week: 1 day    Minutes of Exercise per Session: 30 min  Stress: No Stress Concern Present (01/03/2018)   [employer] of Occupational Health - Occupational Stress  Questionnaire    Feeling of Stress : Not at all  Social Connections: Unknown (01/03/2018)   Social Connection and Isolation Panel [NHANES]    Frequency of Communication with Friends and Family: Not on file    Frequency of Social Gatherings with Friends and Family: Not on file    Attends Religious Services: More than 4 times per year    Active Member of [employer] or Organizations: Yes    Attends Banker Meetings: More than 4 times per year    Marital Status: Married  Catering manager Violence: Not At Risk (01/03/2018)   Humiliation, Afraid, Rape, and Kick questionnaire    Fear of Current or Ex-Partner: No    Emotionally Abused: No    Physically Abused: No    Sexually Abused: No    Current Meds  Medication Sig   cetirizine (ZYRTEC) 10 MG tablet Take 10 mg by mouth daily.   Cholecalciferol (VITAMIN D-3 PO) Take by mouth daily.   Cyanocobalamin (VITAMIN B-12 PO) Take by mouth daily.   FERROUS SULFATE PO Take by mouth daily.   fluticasone (FLONASE) 50 MCG/ACT nasal spray Place 2 sprays into both nostrils daily.   gabapentin (NEURONTIN) 100 MG capsule Take 1 capsule (100 mg total) by mouth 3 (three) times daily. Note also taking Gabapentin 300mg  once daily   gabapentin (NEURONTIN) 300 MG capsule Take 1 capsule (300 mg total) by mouth daily. Note also taking Gabapentin 100mg  3 times daily   Multiple Vitamins-Minerals (MULTIVITAMIN WOMENS 50+ ADV PO) Take by mouth daily.     ROS:  Review of Systems  Constitutional:  Negative for fatigue, fever and unexpected weight change.  Respiratory:  Negative for cough, shortness of breath and wheezing.   Cardiovascular:  Negative for chest pain, palpitations and leg swelling.  Gastrointestinal:  Negative for blood in stool, constipation, diarrhea, nausea and vomiting.  Endocrine: Negative for cold intolerance, heat intolerance and polyuria.  Genitourinary:  Negative for dyspareunia, dysuria, flank pain, frequency, genital sores, hematuria,  menstrual problem, pelvic pain, urgency, vaginal bleeding, vaginal discharge and vaginal pain.  Musculoskeletal:  Negative for back pain, joint swelling and myalgias.  Skin:  Negative for rash.  Neurological:  Negative for dizziness, syncope, light-headedness, numbness and headaches.  Hematological:  Negative for adenopathy.  Psychiatric/Behavioral:  Negative for agitation, confusion, sleep disturbance and suicidal ideas. The patient is not nervous/anxious.      Objective: BP 100/72   Ht 5\' 4"  (1.626 m)   Wt 146 lb (66.2 kg)  LMP 09/13/2022 (Exact Date)   BMI 25.06 kg/m    Physical Exam Constitutional:      Appearance: She is well-developed.  Genitourinary:     Vulva normal.     Right Labia: No rash, tenderness or lesions.    Left Labia: No tenderness, lesions or rash.    No vaginal discharge, erythema or tenderness.      Right Adnexa: not tender and no mass present.    Left Adnexa: not tender and no mass present.    No cervical motion tenderness, friability or polyp.     Uterus is not enlarged or tender.  Breasts:    Right: No mass, nipple discharge, skin change or tenderness.     Left: No mass, nipple discharge, skin change or tenderness.  Neck:     Thyroid: No thyromegaly.  Cardiovascular:     Rate and Rhythm: Normal rate and regular rhythm.     Heart sounds: Normal heart sounds. No murmur heard. Pulmonary:     Effort: Pulmonary effort is normal.     Breath sounds: Normal breath sounds.  Abdominal:     Palpations: Abdomen is soft.     Tenderness: There is no abdominal tenderness. There is no guarding or rebound.  Musculoskeletal:        General: Normal range of motion.     Cervical back: Normal range of motion.  Lymphadenopathy:     Cervical: No cervical adenopathy.  Neurological:     General: No focal deficit present.     Mental Status: She is alert and oriented to person, place, and time.     Cranial Nerves: No cranial nerve deficit.  Skin:    General:  Skin is warm and dry.  Psychiatric:        Mood and Affect: Mood normal.        Behavior: Behavior normal.        Thought Content: Thought content normal.        Judgment: Judgment normal.  Vitals reviewed.     Assessment/Plan: Encounter for annual routine gynecological examination  Cervical cancer screening - Plan: Cytology - PAP  Screening for HPV (human papillomavirus) - Plan: Cytology - PAP  Encounter for screening mammogram for malignant neoplasm of breast; pt has mammo appt  Family history of breast cancer--MyRisk testing discussed and pt declines. F/u prn  Perimenopause--f/u prn AUB.            GYN counsel breast self exam, mammography screening, adequate intake of calcium and vitamin D, diet and exercise     F/U  Return in about 1 year (around 10/21/2023).  Plummer Matich B. Ishanvi Mcquitty, PA-C 10/21/2022 10:49 AM

## 2022-10-21 ENCOUNTER — Encounter: Payer: Self-pay | Admitting: Obstetrics and Gynecology

## 2022-10-21 ENCOUNTER — Ambulatory Visit (INDEPENDENT_AMBULATORY_CARE_PROVIDER_SITE_OTHER): Payer: BC Managed Care – PPO | Admitting: Obstetrics and Gynecology

## 2022-10-21 ENCOUNTER — Other Ambulatory Visit (HOSPITAL_COMMUNITY)
Admission: RE | Admit: 2022-10-21 | Discharge: 2022-10-21 | Disposition: A | Discharge status: Home / Self Care | Payer: BC Managed Care – PPO | Source: Ambulatory Visit | Attending: Obstetrics and Gynecology | Admitting: Obstetrics and Gynecology

## 2022-10-21 VITALS — BP 100/72 | Ht 64.0 in | Wt 146.0 lb

## 2022-10-21 DIAGNOSIS — Z1231 Encounter for screening mammogram for malignant neoplasm of breast: Secondary | ICD-10-CM

## 2022-10-21 DIAGNOSIS — N951 Menopausal and female climacteric states: Secondary | ICD-10-CM

## 2022-10-21 DIAGNOSIS — Z1151 Encounter for screening for human papillomavirus (HPV): Secondary | ICD-10-CM | POA: Insufficient documentation

## 2022-10-21 DIAGNOSIS — Z01419 Encounter for gynecological examination (general) (routine) without abnormal findings: Secondary | ICD-10-CM

## 2022-10-21 DIAGNOSIS — Z124 Encounter for screening for malignant neoplasm of cervix: Secondary | ICD-10-CM | POA: Diagnosis not present

## 2022-10-21 DIAGNOSIS — Z803 Family history of malignant neoplasm of breast: Secondary | ICD-10-CM

## 2022-10-21 NOTE — Patient Instructions (Signed)
I value your feedback and you entrusting us with your care. If you get a [place] patient survey, I would appreciate you taking the time to let us know about your experience today. Thank you! ? ? ?

## 2022-10-25 LAB — CYTOLOGY - PAP
Comment: NEGATIVE
Diagnosis: NEGATIVE
High risk HPV: NEGATIVE

## 2022-11-08 ENCOUNTER — Ambulatory Visit (INDEPENDENT_AMBULATORY_CARE_PROVIDER_SITE_OTHER): Payer: Self-pay | Admitting: Oncology

## 2022-11-08 DIAGNOSIS — Z20822 Contact with and (suspected) exposure to covid-19: Secondary | ICD-10-CM

## 2022-11-08 NOTE — Progress Notes (Signed)
Virtual Visit via Telephone Note  I connected with Blossom Hoops on 11/08/22 at 11:00 AM EDT by telephone and verified that I am speaking with the correct person using two identifiers.  Location: Patient: Home  Provider: Clinic    I discussed the limitations, risks, security and privacy concerns of performing an evaluation and management service by telephone and the availability of in person appointments. I also discussed with the patient that there may be a patient responsible charge related to this service. The patient expressed understanding and agreed to proceed.   History of Present Illness Patient is a 52 year old woman who believes to be exposed to COVID on Sunday morning while at church.  States her friend tested positive later that afternoon.  Reports feeling extreme fatigue starting yesterday morning which has progressively worsened into today.  Also reports some allover body aches although she thinks her temperature is normal.  Believes her appetite is low and she may have a change in taste.  Reports feeling similarly when she had COVID 2 years ago.  Has not tested herself at home but does have a COVID test on hand.  Denies any shortness of breath, cough or chest pain.  Has not taken anything over-the-counter.  Observations/Objective: Review of Systems  Constitutional:  Positive for malaise/fatigue.  Respiratory:  Negative for cough and shortness of breath.   Gastrointestinal:  Negative for diarrhea, nausea and vomiting.  Musculoskeletal:  Positive for myalgias.  Neurological:  Negative for dizziness, weakness and headaches.     Physical Exam Neurological:     Mental Status: She is alert and oriented to person, place, and time.    Assessment and Plan: 1. Close exposure to COVID-19 virus -May use home covid test this afternoon if symptoms worsen.  Otherwise would recommend waiting until the morning closer to the 48 to 72-hour of exposure. -Discussed she may use  ibuprofen 2-3 tabs every 8 hours as needed for body aches, headache and/or fever.  May rotate this with Tylenol.  Drink plenty of fluids.  -Discussed coming into clinic tomorrow should she need additional testing for COVID or her symptoms worsen.   Follow Up Instructions: -Follow-up as needed and if symptoms worsen later today or tomorrow morning.   I discussed the assessment and treatment plan with the patient. The patient was provided an opportunity to ask questions and all were answered. The patient agreed with the plan and demonstrated an understanding of the instructions.   The patient was advised to call back or seek an in-person evaluation if the symptoms worsen or if the condition fails to improve as anticipated.  I provided 11 minutes of non-face-to-face time during this encounter.   Mauro Kaufmann, NP

## 2022-11-09 ENCOUNTER — Encounter: Payer: Self-pay | Admitting: Adult Health

## 2022-11-09 ENCOUNTER — Other Ambulatory Visit: Payer: Self-pay

## 2022-11-09 ENCOUNTER — Ambulatory Visit (INDEPENDENT_AMBULATORY_CARE_PROVIDER_SITE_OTHER): Payer: Self-pay | Admitting: Adult Health

## 2022-11-09 VITALS — BP 130/92 | HR 85 | Temp 97.7°F

## 2022-11-09 DIAGNOSIS — Z20822 Contact with and (suspected) exposure to covid-19: Secondary | ICD-10-CM

## 2022-11-09 DIAGNOSIS — B349 Viral infection, unspecified: Secondary | ICD-10-CM

## 2022-11-09 LAB — POC SOFIA 2 FLU + SARS ANTIGEN FIA
Influenza A, POC: NEGATIVE
Influenza B, POC: NEGATIVE
SARS Coronavirus 2 Ag: NEGATIVE

## 2022-11-09 NOTE — Progress Notes (Signed)
[facility] [address] [place], [place] [address]   Office Visit Note  Patient Name: Yolanda Tran Date of Birth [id number]  Medical Record number [id number]  Date of Service: 11/09/2022  Chief Complaint  Patient presents with   sick    Took an at home test which was negative but jenny told her it may be too soon to test so in clinic today she would like to be tested, states she is feeling achy today      HPI Pt is here for a sick visit. She reports 4 days ago she was exposed while at church to [employer].  The person tested positive the next day.  She  reports she she has been having some fatigue, body aches, headache, taste buds were off for the last two days.  She denies any fever, cough or sore throat.    Current Medication:  Outpatient Encounter Medications as of 11/09/2022  Medication Sig   cetirizine (ZYRTEC) 10 MG tablet Take 10 mg by mouth daily.   Cholecalciferol (VITAMIN D-3 PO) Take by mouth daily.   Cyanocobalamin (VITAMIN B-12 PO) Take by mouth daily.   FERROUS SULFATE PO Take by mouth daily.   fluticasone (FLONASE) 50 MCG/ACT nasal spray Place 2 sprays into both nostrils daily.   gabapentin (NEURONTIN) 100 MG capsule Take 1 capsule (100 mg total) by mouth 3 (three) times daily. Note also taking Gabapentin 300mg  once daily   gabapentin (NEURONTIN) 300 MG capsule Take 1 capsule (300 mg total) by mouth daily. Note also taking Gabapentin 100mg  3 times daily   Multiple Vitamins-Minerals (MULTIVITAMIN WOMENS 50+ ADV PO) Take by mouth daily.   No facility-administered encounter medications on file as of 11/09/2022.      Medical History: Past Medical History:  Diagnosis Date   Alkaline phosphatase elevation    Allergy    Family history of adverse reaction to anesthesia    Mother - BP drops with propofol   History of shingles 03/28/2019   Forehead   Hyperlipidemia    Ovarian cyst    Post herpetic neuralgia    Thrombocytopenia (HCC)    Vitamin D  deficiency disease      Vital Signs: BP (!) 130/92   Pulse 85   Temp 97.7 F (36.5 C) (Tympanic)   LMP 09/13/2022 (Exact Date)   SpO2 97%    Review of Systems  Constitutional:  Positive for fatigue. Negative for chills and fever.  HENT:  Negative for nosebleeds, rhinorrhea and sinus pain.   Musculoskeletal:  Positive for myalgias.  Neurological:  Positive for headaches.    Physical Exam Vitals and nursing note reviewed.  Constitutional:      General: She is not in acute distress.    Appearance: She is normal weight. She is not ill-appearing.  HENT:     Head: Normocephalic.     Right Ear: Tympanic membrane normal.     Left Ear: Tympanic membrane normal.     Nose: Nose normal.     Mouth/Throat:     Mouth: Mucous membranes are dry.  Eyes:     General:        Right eye: No discharge.        Left eye: No discharge.     Pupils: Pupils are equal, round, and reactive to light.  Neck:     Vascular: No carotid bruit.  Cardiovascular:     Rate and Rhythm: Normal rate and regular rhythm.     Pulses: Normal  pulses.     Heart sounds: No murmur heard.    No friction rub.  Pulmonary:     Effort: Pulmonary effort is normal. No respiratory distress.     Breath sounds: Normal breath sounds. No wheezing.  Abdominal:     General: Abdomen is flat. Bowel sounds are normal.     Palpations: Abdomen is soft. There is no mass.     Tenderness: There is no abdominal tenderness. There is no right CVA tenderness or left CVA tenderness.     Hernia: No hernia is present.  Musculoskeletal:        General: Normal range of motion.     Cervical back: Normal range of motion. No rigidity.  Lymphadenopathy:     Cervical: No cervical adenopathy.  Skin:    General: Skin is warm and dry.  Neurological:     General: No focal deficit present.     Mental Status: She is alert and oriented to person, place, and time.  Psychiatric:        Mood and Affect: Mood normal.        Behavior: Behavior normal.     Results for orders placed or performed in visit on 11/09/22 (from the past 24 hour(s))  POC SOFIA 2 FLU + SARS ANTIGEN FIA     Status: Normal   Collection Time: 11/09/22  2:39 PM  Result Value Ref Range   Influenza A, POC Negative Negative   Influenza B, POC Negative Negative   SARS Coronavirus 2 Ag Negative Negative    Assessment/Plan: 1. Viral illness Rest, and drink plenty of water.  Use cough drops, gargle warm sal water or drink wam liquids (like tea with honey) as needed for cough/throat irritation.   Take over-the-counter medicines (such as Dayquil or Nyquil) as discussed at your visit to help manage your symptoms.  Send a MyChart message to the provider or schedule a return appointment as needed for new/worsening symptoms (especially shortness of breath or chest pain) or if symptoms not improving with recommended treatment over the next 5-7 days.      2. Close exposure to COVID-19 virus - POC SOFIA 2 FLU + SARS ANTIGEN FIA     General Counseling: Hilari verbalizes understanding of the findings of todays visit and agrees with plan of treatment. I have discussed any further diagnostic evaluation that may be needed or ordered today. We also reviewed her medications today. she has been encouraged to call the office with any questions or concerns that should arise related to todays visit.   Orders Placed This Encounter  Procedures   POC SOFIA 2 FLU + SARS ANTIGEN FIA    No orders of the defined types were placed in this encounter.   Time spent:20 Minutes    Johnna Acosta AGNP-C Nurse Practitioner

## 2022-12-27 ENCOUNTER — Ambulatory Visit
Admission: RE | Admit: 2022-12-27 | Discharge: 2022-12-27 | Disposition: A | Discharge status: Home / Self Care | Payer: BC Managed Care – PPO | Source: Ambulatory Visit | Attending: Family Medicine | Admitting: Family Medicine

## 2022-12-27 DIAGNOSIS — Z1231 Encounter for screening mammogram for malignant neoplasm of breast: Secondary | ICD-10-CM | POA: Diagnosis present

## 2022-12-30 ENCOUNTER — Other Ambulatory Visit: Payer: Self-pay | Admitting: Family Medicine

## 2022-12-30 DIAGNOSIS — N6489 Other specified disorders of breast: Secondary | ICD-10-CM

## 2022-12-30 DIAGNOSIS — R928 Other abnormal and inconclusive findings on diagnostic imaging of breast: Secondary | ICD-10-CM

## 2022-12-30 DIAGNOSIS — B0229 Other postherpetic nervous system involvement: Secondary | ICD-10-CM

## 2022-12-30 NOTE — Telephone Encounter (Signed)
Request is too soon, last refill 07/06/22 for 90 and 3 refills.  Requested Prescriptions  Pending Prescriptions Disp Refills   gabapentin (NEURONTIN) 300 MG capsule [Pharmacy Med Name: GABAPENTIN 300MG  CAPSULES] 45 capsule     Sig: TAKE 1 CAPSULE(300 MG) BY MOUTH DAILY AS NEEDED FOR HEADACHE     Neurology: Anticonvulsants - gabapentin Passed - 12/30/2022  3:31 AM      Passed - Cr in normal range and within 360 days    Creat  Date Value Ref Range Status  08/23/2022 0.63 0.50 - 1.03 mg/dL Final         Passed - Completed PHQ-2 or PHQ-9 in the last 360 days      Passed - Valid encounter within last 12 months    Recent Outpatient Visits           4 months ago Annual physical exam   [place] [facility] Smitty Cords, DO   10 months ago Post herpetic neuralgia   [facility] Smitty Cords, DO   2 years ago Post herpetic neuralgia   [place] [facility], Netta Neat, DO   2 years ago Acute non-recurrent frontal sinusitis   [place] [facility] Smitty Cords, DO   3 years ago Post herpetic neuralgia   [place] [facility], Netta Neat, DO       Future Appointments             In 8 months Althea Charon, Netta Neat, DO [place] [facility], [facility]

## 2023-01-10 ENCOUNTER — Ambulatory Visit
Admission: RE | Admit: 2023-01-10 | Discharge: 2023-01-10 | Disposition: A | Discharge status: Home / Self Care | Payer: BC Managed Care – PPO | Source: Ambulatory Visit | Attending: Family Medicine | Admitting: Family Medicine

## 2023-01-10 DIAGNOSIS — N6489 Other specified disorders of breast: Secondary | ICD-10-CM

## 2023-01-10 DIAGNOSIS — R928 Other abnormal and inconclusive findings on diagnostic imaging of breast: Secondary | ICD-10-CM | POA: Diagnosis present

## 2023-07-13 ENCOUNTER — Other Ambulatory Visit: Payer: Self-pay | Admitting: Family Medicine

## 2023-07-13 DIAGNOSIS — B0229 Other postherpetic nervous system involvement: Secondary | ICD-10-CM

## 2023-07-14 NOTE — Telephone Encounter (Signed)
 Appointment 08/31/23 Requested Prescriptions  Pending Prescriptions Disp Refills   gabapentin (NEURONTIN) 100 MG capsule [Pharmacy Med Name: GABAPENTIN 100MG  CAPSULES] 270 capsule 3    Sig: TAKE 1 CAPSULE(100 MG) BY MOUTH THREE TIMES DAILY     Neurology: Anticonvulsants - gabapentin Failed - 07/14/2023 11:55 AM      Failed - Valid encounter within last 12 months    Recent Outpatient Visits   None     Future Appointments             In 1 month Karamalegos, Netta Neat, DO [place] [facility]            Passed - Cr in normal range and within 360 days    Creat  Date Value Ref Range Status  08/23/2022 0.63 0.50 - 1.03 mg/dL Final         Passed - Completed PHQ-2 or PHQ-9 in the last 360 days

## 2023-07-20 ENCOUNTER — Other Ambulatory Visit: Payer: Self-pay | Admitting: Family Medicine

## 2023-07-20 DIAGNOSIS — B0229 Other postherpetic nervous system involvement: Secondary | ICD-10-CM

## 2023-07-21 NOTE — Telephone Encounter (Signed)
 Duplicate request, last refilled 07/11/23 for 90 day.  Requested Prescriptions  Pending Prescriptions Disp Refills   gabapentin (NEURONTIN) 100 MG capsule [Pharmacy Med Name: GABAPENTIN 100MG  CAPSULES] 270 capsule 0    Sig: TAKE 1 CAPSULE(100 MG) BY MOUTH THREE TIMES DAILY     Neurology: Anticonvulsants - gabapentin Failed - 07/21/2023 10:49 AM      Failed - Valid encounter within last 12 months    Recent Outpatient Visits   None     Future Appointments             In 1 month Karamalegos, Netta Neat, DO [place] [facility], [facility]            Passed - Cr in normal range and within 360 days    Creat  Date Value Ref Range Status  08/23/2022 0.63 0.50 - 1.03 mg/dL Final         Passed - Completed PHQ-2 or PHQ-9 in the last 360 days

## 2023-08-08 ENCOUNTER — Other Ambulatory Visit: Payer: Self-pay | Admitting: Family Medicine

## 2023-08-08 DIAGNOSIS — B0229 Other postherpetic nervous system involvement: Secondary | ICD-10-CM

## 2023-08-08 NOTE — Telephone Encounter (Signed)
 Requested Prescriptions  Pending Prescriptions Disp Refills   gabapentin  (NEURONTIN ) 300 MG capsule [Pharmacy Med Name: GABAPENTIN  300MG  CAPSULES] 90 capsule 0    Sig: TAKE 1 CAPSULE(300 MG) BY MOUTH DAILY     Neurology: Anticonvulsants - gabapentin  Failed - 08/08/2023  5:02 PM      Failed - Valid encounter within last 12 months    Recent Outpatient Visits   None     Future Appointments             In 1 month Karamalegos, Kayleen Party, DO [facility], [facility]            Passed - Cr in normal range and within 360 days    Creat  Date Value Ref Range Status  08/23/2022 0.63 0.50 - 1.03 mg/dL Final         Passed - Completed PHQ-2 or PHQ-9 in the last 360 days

## 2023-08-31 ENCOUNTER — Other Ambulatory Visit: Payer: Self-pay

## 2023-09-04 ENCOUNTER — Other Ambulatory Visit: Payer: Self-pay | Admitting: Family Medicine

## 2023-09-04 DIAGNOSIS — E559 Vitamin D deficiency, unspecified: Secondary | ICD-10-CM

## 2023-09-04 DIAGNOSIS — D696 Thrombocytopenia, unspecified: Secondary | ICD-10-CM

## 2023-09-04 DIAGNOSIS — E7800 Pure hypercholesterolemia, unspecified: Secondary | ICD-10-CM

## 2023-09-04 DIAGNOSIS — E782 Mixed hyperlipidemia: Secondary | ICD-10-CM

## 2023-09-04 DIAGNOSIS — Z0000 Encounter for general adult medical examination without abnormal findings: Secondary | ICD-10-CM

## 2023-09-04 DIAGNOSIS — R7309 Other abnormal glucose: Secondary | ICD-10-CM

## 2023-09-05 ENCOUNTER — Other Ambulatory Visit

## 2023-09-06 LAB — COMPREHENSIVE METABOLIC PANEL WITH GFR
AG Ratio: 1.9 (calc) (ref 1.0–2.5)
ALT: 49 U/L — ABNORMAL HIGH (ref 6–29)
AST: 37 U/L — ABNORMAL HIGH (ref 10–35)
Albumin: 4.2 g/dL (ref 3.6–5.1)
Alkaline phosphatase (APISO): 240 U/L — ABNORMAL HIGH (ref 37–153)
BUN/Creatinine Ratio: 39 (calc) — ABNORMAL HIGH (ref 6–22)
BUN: 17 mg/dL (ref 7–25)
CO2: 27 mmol/L (ref 20–32)
Calcium: 10.1 mg/dL (ref 8.6–10.4)
Chloride: 106 mmol/L (ref 98–110)
Creat: 0.44 mg/dL — ABNORMAL LOW (ref 0.50–1.03)
Globulin: 2.2 g/dL (ref 1.9–3.7)
Glucose, Bld: 86 mg/dL (ref 65–99)
Potassium: 3.9 mmol/L (ref 3.5–5.3)
Sodium: 141 mmol/L (ref 135–146)
Total Bilirubin: 0.7 mg/dL (ref 0.2–1.2)
Total Protein: 6.4 g/dL (ref 6.1–8.1)
eGFR: 115 mL/min/1.73m2

## 2023-09-06 LAB — CBC WITH DIFFERENTIAL/PLATELET
Absolute Lymphocytes: 1625 {cells}/uL (ref 850–3900)
Absolute Monocytes: 635 {cells}/uL (ref 200–950)
Basophils Absolute: 10 {cells}/uL (ref 0–200)
Basophils Relative: 0.2 %
Eosinophils Absolute: 120 {cells}/uL (ref 15–500)
Eosinophils Relative: 2.4 %
HCT: 40.8 % (ref 35.0–45.0)
Hemoglobin: 13.1 g/dL (ref 11.7–15.5)
MCH: 26.2 pg — ABNORMAL LOW (ref 27.0–33.0)
MCHC: 32.1 g/dL (ref 32.0–36.0)
MCV: 81.6 fL (ref 80.0–100.0)
MPV: 14.1 fL — ABNORMAL HIGH (ref 7.5–12.5)
Monocytes Relative: 12.7 %
Neutro Abs: 2610 {cells}/uL (ref 1500–7800)
Neutrophils Relative %: 52.2 %
Platelets: 102 10*3/uL — ABNORMAL LOW (ref 140–400)
RBC: 5 10*6/uL (ref 3.80–5.10)
RDW: 12.6 % (ref 11.0–15.0)
Total Lymphocyte: 32.5 %
WBC: 5 10*3/uL (ref 3.8–10.8)

## 2023-09-06 LAB — LIPID PANEL
Cholesterol: 190 mg/dL (ref <200)
HDL: 56 mg/dL (ref >=50)
LDL Cholesterol (Calc): 113 mg/dL — ABNORMAL HIGH
Non-HDL Cholesterol (Calc): 134 mg/dL — ABNORMAL HIGH (ref <130)
Total CHOL/HDL Ratio: 3.4 (calc) (ref <5.0)
Triglycerides: 103 mg/dL (ref <150)

## 2023-09-06 LAB — HEMOGLOBIN A1C
Hgb A1c MFr Bld: 5.5 % (ref <5.7)
Mean Plasma Glucose: 111 mg/dL
eAG (mmol/L): 6.2 mmol/L

## 2023-09-06 LAB — TSH: TSH: <0.01 m[IU]/L — ABNORMAL LOW

## 2023-09-06 LAB — VITAMIN D 25 HYDROXY (VIT D DEFICIENCY, FRACTURES): Vit D, 25-Hydroxy: 51 ng/mL (ref 30–100)

## 2023-09-08 ENCOUNTER — Encounter: Payer: Self-pay | Admitting: Family Medicine

## 2023-09-12 ENCOUNTER — Ambulatory Visit (INDEPENDENT_AMBULATORY_CARE_PROVIDER_SITE_OTHER): Admitting: Family Medicine

## 2023-09-12 ENCOUNTER — Other Ambulatory Visit: Payer: Self-pay | Admitting: Family Medicine

## 2023-09-12 VITALS — BP 132/78 | HR 102 | Ht 64.0 in | Wt 129.2 lb

## 2023-09-12 DIAGNOSIS — E7800 Pure hypercholesterolemia, unspecified: Secondary | ICD-10-CM

## 2023-09-12 DIAGNOSIS — Z0000 Encounter for general adult medical examination without abnormal findings: Secondary | ICD-10-CM

## 2023-09-12 DIAGNOSIS — R7989 Other specified abnormal findings of blood chemistry: Secondary | ICD-10-CM

## 2023-09-12 DIAGNOSIS — N951 Menopausal and female climacteric states: Secondary | ICD-10-CM | POA: Insufficient documentation

## 2023-09-12 DIAGNOSIS — D696 Thrombocytopenia, unspecified: Secondary | ICD-10-CM | POA: Diagnosis not present

## 2023-09-12 DIAGNOSIS — E559 Vitamin D deficiency, unspecified: Secondary | ICD-10-CM

## 2023-09-12 DIAGNOSIS — B0229 Other postherpetic nervous system involvement: Secondary | ICD-10-CM

## 2023-09-12 MED ORDER — GABAPENTIN 100 MG PO CAPS: 100.0000 mg | ORAL_CAPSULE | Freq: Three times a day (TID) | ORAL | 3 refills | Status: AC

## 2023-09-12 MED ORDER — GABAPENTIN 300 MG PO CAPS
300.0000 mg | ORAL_CAPSULE | Freq: Every day | ORAL | 3 refills | Status: AC
Start: 2023-09-12 — End: ?

## 2023-09-12 NOTE — Patient Instructions (Addendum)
 Thank you for coming to the office today.  Check Goli Gummy melatonin dose, up to 5 to 10mg  per night is reasonable. 10 is the max.  Switch Gabapentin  to higher dose at bedtime to prevent night time hot flashes.  Estroven natural menopausal supplement, Sleep Cool  Sleep Hygiene Recommendations to promote healthy sleep in all patients, especially if symptoms of insomnia are worsening. Due to the nature of sleep rhythms, if your body gets "out of rhythm", it may take some time before your sleep cycle can be "reset".  Please try to follow as many of the following tips as you can, usually there are only a few of these are the primary cause of the problem.  ?To reset your sleep rhythm, go to bed and get up at the same time every day ?Sleep only long enough to feel rested and then get out of bed ?Do not try to force yourself to sleep. If you can't sleep, get out of bed and try again later. ?Avoid naps during the day, unless excessively tired. The more sleeping during the day, then the less sleep your body needs at night.  ?Have coffee, tea, and other foods that have caffeine only in the morning ?Exercise several days a week, but not right before bed ?If you drink alcohol, prefer to have appropriate drink with one meal, but prefer to avoid alcohol in the evening, and bedtime ?If you smoke, avoid smoking, especially in the evening  ?Avoid watching TV or looking at phones, computers, or reading devices ("e-books") that give off light at least 30 minutes before bed. This artificial light sends "awake signals" to your brain and can make it harder to fall asleep. ?Make your bedroom a comfortable place where it is easy to fall asleep: Put up shades or special blackout curtains to block light from outside. Use a white noise machine to block noise. Keep the temperature cool. ?Try your best to solve or at least address your problems before you go to bed ?Use relaxation techniques to manage stress. Ask  your health care provider to suggest some techniques that may work well for you. These may include: Breathing exercises. Routines to release muscle tension. Visualizing peaceful scenes.  ------------------------   Gabapentin  refills 100-300mg  doses. For post-herpetic neuralgia  For Future Consideration Coronary Calcium Score Cardiac CT Scan. This is a screening test for patients aged 86-50+ with cardiovascular risk factors or who are healthy but would be interested in Cardiovascular Screening for heart disease. Even if there is a family history of heart disease, this imaging can be useful. Typically it can be done every 5+ years or at a different timeline we agree on  The scan will look at the chest and mainly focus on the heart and identify early signs of calcium build up or blockages within the heart arteries. It is not 100% accurate for identifying blockages or heart disease, but it is useful to help us  predict who may have some early changes or be at risk in the future for a heart attack or cardiovascular problem.  The results are reviewed by a Cardiologist and they will document the results. It should become available on MyChart. Typically the results are divided into percentiles based on other patients of the same demographic and age. So it will compare your risk to others similar to you. If you have a higher score >99 or higher percentile >75%tile, it is recommended to consider Statin cholesterol therapy and or referral to Cardiologist. I will try to  help explain your results and if we have questions we can contact the Cardiologist.  You will be contacted for scheduling. Usually it is done at any imaging facility through [facility], [facility] or [facility].  The cost is $99 flat fee total and it does not go through insurance, so no authorization is required.  DUE for FASTING BLOOD WORK (no food or drink after midnight before the lab appointment, only  water  or coffee without cream/sugar on the morning of)  SCHEDULE "Lab Only" visit in the morning at the clinic for lab draw in 3 MONTHS   - Make sure Lab Only appointment is at about 1 week before your next appointment, so that results will be available  For Lab Results, once available within 2-3 days of blood draw, you can can log in to MyChart online to view your results and a brief explanation. Also, we can discuss results at next follow-up visit.   Please schedule a Follow-up Appointment to: Return in about 3 months (around 12/13/2023) for 3 month fasting lab.  If you have any other questions or concerns, please feel free to call the office or send a message through MyChart. You may also schedule an earlier appointment if necessary.  Additionally, you may be receiving a survey about your experience at our office within a few days to 1 week by e-mail or mail. We value your feedback.  Domingo Friend, DO [facility], [place]

## 2023-09-12 NOTE — Progress Notes (Signed)
 Subjective:    Patient ID: Yolanda Tran, female    DOB: Oct 27, 1970, 53 y.o.   MRN: [id number]  Yolanda Tran is a 53 y.o. female presenting on 09/12/2023 for Annual Exam   HPI   Discussed the use of AI scribe software for clinical note transcription with the patient, who gave verbal consent to proceed.  History of Present Illness   Yolanda Tran is a 53 year old female who presents for an annual physical exam.      Updates mother passed, reduced appetite, staying hydrated, not drinking much alcohol Weight down 20 lbs in 1 year, LDL cholesterol major improved But still elevated LDL Recent lab results indicated slightly elevated cholesterol, normal vitamin D  levels, and low creatinine.  She attributes weight loss to stress and reduced appetite following her mother's illness and passing last year. She consumes more fluids than food, focusing on water , juices, and occasionally adding lemon, honey, and apple cider vinegar. She avoids fried foods, primarily eating chicken and fish.     HYPERLIPIDEMIA: - Reports concern with elevated cholesterol. Last lipid panel 08/2023, mild elevated LDL 113 down from 147 - Not on statin therapy   Thrombocytopenia Last lab 102 PLT, mild low, admits easy bruising but no significant complications. She has prior lab 7 yr ago PLT 126.   Vitamin D  Currently VItamin D  51, normal    Post Herpetic Neuralgia She is doing well on Gabapentin  300mg  AM and 200-300mg  PM AS NEEDED  Postmenopausal vasomotor Hot flashes She experiences menopausal symptoms, including hot flashes and difficulty sleeping, and uses a Goli gummy sleep supplement. She takes gabapentin  for post herpetic neuralgia, 100 mg three times a day and 300 mg once a day, but it has not alleviated her hot flashes.     Health Maintenance:   Shingrix vaccine, completed, 2023. 2 doses.  Colonoscopy done 11/12/19 negative repeat 10 years 2031   Cervical cancer screening  10/21/23, negative pap and negative HPV, repeat 5.   Mammogram 12/2022, birads 0 incomplete, then diagnostic Mammo + US  negative, check yearly 12/2023      09/12/2023    4:04 PM 08/30/2022    4:16 PM 02/22/2022    4:06 PM  Depression screen PHQ 2/9  Decreased Interest 0 0 0  Down, Depressed, Hopeless 0 0 0  PHQ - 2 Score 0 0 0  Altered sleeping 2  0  Tired, decreased energy 1  0  Change in appetite 1  0  Feeling bad or failure about yourself  0  0  Trouble concentrating 1  0  Moving slowly or fidgety/restless 0  0  Suicidal thoughts 0  0  PHQ-9 Score 5  0  Difficult doing work/chores Not difficult at all  Not difficult at all       09/12/2023    4:05 PM 08/30/2022    4:16 PM 02/22/2022    4:07 PM 09/23/2020    1:39 PM  GAD 7 : Generalized Anxiety Score  Nervous, Anxious, on Edge 0 0 0 0  Control/stop worrying 0 0 0 0  Worry too much - different things 0 0 0 0  Trouble relaxing 0 0 0 0  Restless 0 0 0 0  Easily annoyed or irritable 1 0 0 0  Afraid - awful might happen 0 0 0 0  Total GAD 7 Score 1 0 0 0  Anxiety Difficulty Not difficult at all  Not difficult at all Not difficult at all  Past Medical History:  Diagnosis Date   Alkaline phosphatase elevation    Allergy    Family history of adverse reaction to anesthesia    Mother - BP drops with propofol    History of shingles 03/28/2019   Forehead   Hyperlipidemia    Ovarian cyst    Post herpetic neuralgia    Thrombocytopenia (HCC)    Vitamin D  deficiency disease    Past Surgical History:  Procedure Laterality Date   BREAST SURGERY  2003   tissue removed from breast   COLONOSCOPY WITH PROPOFOL  N/A 11/12/2019   Procedure: COLONOSCOPY WITH PROPOFOL ;  Surgeon: Irby Mannan, MD;  Location: [facility];  Service: Endoscopy;  Laterality: N/A;   TUBAL LIGATION  2002   Social History   Socioeconomic History   Marital status: Married    Spouse name: Not on file   Number of children: 2   Years of  education: Not on file   Highest education level: Bachelor's degree (e.g., BA, AB, BS)  Occupational History   Not on file  Tobacco Use   Smoking status: Never   Smokeless tobacco: Never  Vaping Use   Vaping status: Never Used  Substance and Sexual Activity   Alcohol use: Yes    Comment: 2-3 monthly    Drug use: No   Sexual activity: Yes    Birth control/protection: None, Surgical    Comment: tubal ligation  Other Topics Concern   Not on file  Social History Narrative   Not on file   Social Drivers of Health   Financial Resource Strain: Low Risk  (09/12/2023)   Overall Financial Resource Strain (CARDIA)    Difficulty of Paying Living Expenses: Not very hard  Food Insecurity: No Food Insecurity (09/12/2023)   Hunger Vital Sign    Worried About Running Out of Food in the Last Year: Never true    Ran Out of Food in the Last Year: Never true  Transportation Needs: No Transportation Needs (09/12/2023)   PRAPARE - Administrator, Civil Service (Medical): No    Lack of Transportation (Non-Medical): No  Physical Activity: Insufficiently Active (09/12/2023)   Exercise Vital Sign    Days of Exercise per Week: 1 day    Minutes of Exercise per Session: 40 min  Stress: Stress Concern Present (09/12/2023)   [employer] of Occupational Health - Occupational Stress Questionnaire    Feeling of Stress : To some extent  Social Connections: Socially Integrated (09/12/2023)   Social Connection and Isolation Panel [NHANES]    Frequency of Communication with Friends and Family: Twice a week    Frequency of Social Gatherings with Friends and Family: Once a week    Attends Religious Services: More than 4 times per year    Active Member of [employer] or Organizations: Yes    Attends Engineer, structural: More than 4 times per year    Marital Status: Married  Catering manager Violence: Not At Risk (01/03/2018)   Humiliation, Afraid, Rape, and Kick questionnaire    Fear of  Current or Ex-Partner: No    Emotionally Abused: No    Physically Abused: No    Sexually Abused: No   Family History  Problem Relation Age of Onset   Stroke Mother 66   Hypertension Mother    Heart disease Mother    Depression Mother    Diabetes Father    Heart disease Father    Congestive Heart Failure Father    Cancer  Father        bone   Stroke Maternal Grandmother    Heart disease Maternal Grandfather    Diabetes Paternal Grandmother    Healthy Daughter    Healthy Son    Breast cancer Maternal Aunt        40s/50s   Colon cancer Maternal Aunt 60   Breast cancer Maternal Aunt        40s/50s   Breast cancer Cousin        mid 75s, has contact   COPD Neg Hx    Current Outpatient Medications on File Prior to Visit  Medication Sig   cetirizine (ZYRTEC) 10 MG tablet Take 10 mg by mouth daily.   Cholecalciferol (VITAMIN D -3 PO) Take by mouth daily.   Cyanocobalamin (VITAMIN B-12 PO) Take by mouth daily.   FERROUS SULFATE PO Take by mouth daily.   fluticasone  (FLONASE ) 50 MCG/ACT nasal spray Place 2 sprays into both nostrils daily.   Multiple Vitamins-Minerals (MULTIVITAMIN WOMENS 50+ ADV PO) Take by mouth daily.   No current facility-administered medications on file prior to visit.    Review of Systems  Constitutional:  Negative for activity change, appetite change, chills, diaphoresis, fatigue and fever.  HENT:  Negative for congestion and hearing loss.   Eyes:  Negative for visual disturbance.  Respiratory:  Negative for cough, chest tightness, shortness of breath and wheezing.   Cardiovascular:  Negative for chest pain, palpitations and leg swelling.  Gastrointestinal:  Negative for abdominal pain, constipation, diarrhea, nausea and vomiting.  Genitourinary:  Negative for dysuria, frequency and hematuria.  Musculoskeletal:  Negative for arthralgias and neck pain.  Skin:  Negative for rash.  Neurological:  Negative for dizziness, weakness, light-headedness, numbness  and headaches.  Hematological:  Negative for adenopathy.  Psychiatric/Behavioral:  Negative for behavioral problems, dysphoric mood and sleep disturbance.    Per HPI unless specifically indicated above     Objective:     BP 132/78 (BP Location: Left Arm, Patient Position: Sitting, Cuff Size: Normal)   Pulse (!) 102   Ht 5\' 4"  (1.626 m)   Wt 129 lb 4 oz (58.6 kg)   SpO2 98%   BMI 22.19 kg/m   Wt Readings from Last 3 Encounters:  09/12/23 129 lb 4 oz (58.6 kg)  10/21/22 146 lb (66.2 kg)  08/30/22 149 lb (67.6 kg)    Physical Exam Vitals and nursing note reviewed.  Constitutional:      General: She is not in acute distress.    Appearance: She is well-developed. She is not diaphoretic.     Comments: Well-appearing, comfortable, cooperative  HENT:     Head: Normocephalic and atraumatic.     Right Ear: Tympanic membrane, ear canal and external ear normal. There is no impacted cerumen.     Left Ear: Tympanic membrane, ear canal and external ear normal. There is no impacted cerumen.  Eyes:     General:        Right eye: No discharge.        Left eye: No discharge.     Conjunctiva/sclera: Conjunctivae normal.     Pupils: Pupils are equal, round, and reactive to light.  Neck:     Thyroid: No thyromegaly.     Vascular: No carotid bruit.     Comments: Thyromegaly on exam, non focal. No nodules or masses Cardiovascular:     Rate and Rhythm: Normal rate and regular rhythm.     Pulses: Normal pulses.  Heart sounds: Normal heart sounds. No murmur heard. Pulmonary:     Effort: Pulmonary effort is normal. No respiratory distress.     Breath sounds: Normal breath sounds. No wheezing or rales.  Abdominal:     General: Bowel sounds are normal. There is no distension.     Palpations: Abdomen is soft. There is no mass.     Tenderness: There is no abdominal tenderness.  Musculoskeletal:        General: No tenderness. Normal range of motion.     Cervical back: Normal range of motion  and neck supple.     Right lower leg: No edema.     Left lower leg: No edema.     Comments: Upper / Lower Extremities: - Normal muscle tone, strength bilateral upper extremities 5/5, lower extremities 5/5  Lymphadenopathy:     Cervical: No cervical adenopathy.  Skin:    General: Skin is warm and dry.     Findings: No erythema or rash.  Neurological:     Mental Status: She is alert and oriented to person, place, and time.     Comments: Distal sensation intact to light touch all extremities  Psychiatric:        Mood and Affect: Mood normal.        Behavior: Behavior normal.        Thought Content: Thought content normal.     Comments: Well groomed, good eye contact, normal speech and thoughts      Results for orders placed or performed in visit on 09/04/23  TSH   Collection Time: 09/05/23  9:18 AM  Result Value Ref Range   TSH <0.01 (L) mIU/L  Lipid panel   Collection Time: 09/05/23  9:18 AM  Result Value Ref Range   Cholesterol 190 <200 mg/dL   HDL 56 > OR = 50 mg/dL   Triglycerides 259 <563 mg/dL   LDL Cholesterol (Calc) 113 (H) mg/dL (calc)   Total CHOL/HDL Ratio 3.4 <5.0 (calc)   Non-HDL Cholesterol (Calc) 134 (H) <130 mg/dL (calc)  Hemoglobin O7F   Collection Time: 09/05/23  9:18 AM  Result Value Ref Range   Hgb A1c MFr Bld 5.5 <5.7 %   Mean Plasma Glucose 111 mg/dL   eAG (mmol/L) 6.2 mmol/L  CBC with Differential/Platelet   Collection Time: 09/05/23  9:18 AM  Result Value Ref Range   WBC 5.0 3.8 - 10.8 Thousand/uL   RBC 5.00 3.80 - 5.10 Million/uL   Hemoglobin 13.1 11.7 - 15.5 g/dL   HCT 64.3 32.9 - 51.8 %   MCV 81.6 80.0 - 100.0 fL   MCH 26.2 (L) 27.0 - 33.0 pg   MCHC 32.1 32.0 - 36.0 g/dL   RDW 84.1 66.0 - 63.0 %   Platelets 102 (L) 140 - 400 Thousand/uL   MPV 14.1 (H) 7.5 - 12.5 fL   Neutro Abs 2,610 1,500 - 7,800 cells/uL   Absolute Lymphocytes 1,625 850 - 3,900 cells/uL   Absolute Monocytes 635 200 - 950 cells/uL   Eosinophils Absolute 120 15 - 500  cells/uL   Basophils Absolute 10 0 - 200 cells/uL   Neutrophils Relative % 52.2 %   Total Lymphocyte 32.5 %   Monocytes Relative 12.7 %   Eosinophils Relative 2.4 %   Basophils Relative 0.2 %  Comprehensive metabolic panel with GFR   Collection Time: 09/05/23  9:18 AM  Result Value Ref Range   Glucose, Bld 86 65 - 99 mg/dL   BUN 17 7 -  25 mg/dL   Creat 4.54 (L) 0.98 - 1.03 mg/dL   eGFR 119 > OR = 60 JY/NWG/9.56O1   BUN/Creatinine Ratio 39 (H) 6 - 22 (calc)   Sodium 141 135 - 146 mmol/L   Potassium 3.9 3.5 - 5.3 mmol/L   Chloride 106 98 - 110 mmol/L   CO2 27 20 - 32 mmol/L   Calcium 10.1 8.6 - 10.4 mg/dL   Total Protein 6.4 6.1 - 8.1 g/dL   Albumin 4.2 3.6 - 5.1 g/dL   Globulin 2.2 1.9 - 3.7 g/dL (calc)   AG Ratio 1.9 1.0 - 2.5 (calc)   Total Bilirubin 0.7 0.2 - 1.2 mg/dL   Alkaline phosphatase (APISO) 240 (H) 37 - 153 U/L   AST 37 (H) 10 - 35 U/L   ALT 49 (H) 6 - 29 U/L  VITAMIN D  25 Hydroxy (Vit-D Deficiency, Fractures)   Collection Time: 09/05/23  9:18 AM  Result Value Ref Range   Vit D, 25-Hydroxy 51 30 - 100 ng/mL      Assessment & Plan:   Problem List Items Addressed This Visit     Hyperlipidemia   Menopausal vasomotor syndrome   Post herpetic neuralgia   Relevant Medications   gabapentin  (NEURONTIN ) 100 MG capsule   gabapentin  (NEURONTIN ) 300 MG capsule   Thrombocytopenia (HCC)   Vitamin D  deficiency disease   Other Visit Diagnoses       Annual physical exam    -  Primary     Elevated LFTs         Low TSH level            Updated Health Maintenance information Reviewed recent lab results with patient Encouraged improvement to lifestyle with diet and exercise Goal of weight loss  Possible Hyperthyroidism Low TSH suggests she may be at risk of hyperthyroidism. Weight loss may be related. Thyroid gland slightly full, no nodules. - Order repeat TSH and T4 in 3 months. - Consider endocrinology +/- Thyroid US , referral if hyperthyroidism  confirmed.  Elevated liver enzymes Mild elevation in liver enzymes. Similar pattern 6 years ago. Minimal alcohol intake, unlikely cause. Suspect elevated cholesterol can raise LFTs. Note she has lost weight, seems less likely due to her weight - Recheck liver enzymes in 3 months with thyroid tests.  Hyperlipidemia Cholesterol levels improved significantly LDL 147 down to 113. Weight loss likely contributed. Not on Statin therapy.  Thrombocytopenia, chronic Consistently low platelet count for 7-8 years. No major complications or anemia.  Menopausal vasomotor symptoms Experiencing hot flashes and sleep disturbances. Discussed gabapentin  and Estroven for symptom relief. - Switch gabapentin  300 mg + 100mg  at night time, instead of higher dose during day. - Consider melatonin up to 10 mg for sleep. - Consider Estroven supplement natural supplement - Sleep hygiene reviewed  General Health Maintenance Reviewed vaccinations and screenings. Shingles vaccine completed, tetanus status uncertain. Mammogram and Pap smear up to date. - Verify tetanus vaccination status.  Follow-up Plan for follow-up testing and monitoring. Discussed optional coronary artery calcium scan CT - Schedule blood tests for thyroid and liver function on November 23, 2023. - Monitor MyChart for results.      No orders of the defined types were placed in this encounter.   Meds ordered this encounter  Medications   gabapentin  (NEURONTIN ) 100 MG capsule    Sig: Take 1 capsule (100 mg total) by mouth 3 (three) times daily.    Dispense:  270 capsule    Refill:  3  gabapentin  (NEURONTIN ) 300 MG capsule    Sig: Take 1 capsule (300 mg total) by mouth at bedtime.    Dispense:  90 capsule    Refill:  3    Follow up plan: Return in about 3 months (around 12/13/2023) for 3 month fasting lab.  11/23/23 CMET, TSH, T4   Domingo Friend, DO [facility] 09/12/2023, 4:23  PM

## 2023-10-11 ENCOUNTER — Ambulatory Visit (INDEPENDENT_AMBULATORY_CARE_PROVIDER_SITE_OTHER): Payer: Self-pay | Admitting: Adult Health

## 2023-10-11 ENCOUNTER — Encounter: Payer: Self-pay | Admitting: Adult Health

## 2023-10-11 VITALS — BP 127/61 | HR 105 | Temp 97.1°F | Ht 64.0 in | Wt 129.0 lb

## 2023-10-11 DIAGNOSIS — S6992XA Unspecified injury of left wrist, hand and finger(s), initial encounter: Secondary | ICD-10-CM

## 2023-10-11 NOTE — Progress Notes (Signed)
 [facility] [address] [place], [place] [address]   Office Visit Note  Patient Name: Yolanda Tran Date of Birth [id number]  Medical Record number [id number]  Date of Service: 10/11/2023  Chief Complaint  Patient presents with   L ring finger injury    Patient got her finger caught in a door a few weeks ago and is concerned about the skin surrounding the area. She feels like it is becoming detached.      HPI Pt is here reporting she smashed her finger left hand in the car door about 2 weeks ago.  She feels like it looks different   Current Medication:  Outpatient Encounter Medications as of 10/11/2023  Medication Sig   cetirizine (ZYRTEC) 10 MG tablet Take 10 mg by mouth daily.   Cholecalciferol (VITAMIN D -3 PO) Take by mouth daily.   Cyanocobalamin (VITAMIN B-12 PO) Take by mouth daily.   FERROUS SULFATE PO Take by mouth daily.   fluticasone  (FLONASE ) 50 MCG/ACT nasal spray Place 2 sprays into both nostrils daily. (Patient taking differently: Place 2 sprays into both nostrils 2 (two) times daily as needed.)   gabapentin  (NEURONTIN ) 100 MG capsule Take 1 capsule (100 mg total) by mouth 3 (three) times daily. (Patient taking differently: Take 100 mg by mouth 2 (two) times daily.)   Multiple Vitamins-Minerals (MULTIVITAMIN WOMENS 50+ ADV PO) Take by mouth daily.   gabapentin  (NEURONTIN ) 300 MG capsule Take 1 capsule (300 mg total) by mouth at bedtime. (Patient not taking: Reported on 10/11/2023)   No facility-administered encounter medications on file as of 10/11/2023.      Medical History: Past Medical History:  Diagnosis Date   Alkaline phosphatase elevation    Allergy    Family history of adverse reaction to anesthesia    Mother - BP drops with propofol    History of shingles 03/28/2019   Forehead   Hyperlipidemia    Ovarian cyst    Post herpetic neuralgia    Thrombocytopenia (HCC)    Vitamin D  deficiency disease      Vital Signs: BP 127/61    Pulse (!) 105   Temp (!) 97.1 F (36.2 C)   Ht 5' 4 (1.626 m)   Wt 129 lb (58.5 kg)   SpO2 99%   BMI 22.14 kg/m    Review of Systems  Skin:  Positive for wound.    Physical Exam Vitals reviewed.   Musculoskeletal:       Hands:     Comments: Ring finger left hand WNL.  No swelling, redness, drainage or bruising.  Nail appears to be separated from nail bed in center of nail.     Assessment/Plan: 1. Injury of finger of left hand, initial encounter (Primary) Epsom salt soaks.  Watch and wait. Follow up via MyChart messenger if symptoms fail to improve or may return to clinic as needed for worsening symptoms.       General Counseling: Cressida verbalizes understanding of the findings of todays visit and agrees with plan of treatment. I have discussed any further diagnostic evaluation that may be needed or ordered today. We also reviewed her medications today. she has been encouraged to call the office with any questions or concerns that should arise related to todays visit.   No orders of the defined types were placed in this encounter.   No orders of the defined types were placed in this encounter.   Time spent:15 Minutes    Juliene DOROTHA Howells AGNP-C Nurse  Practitioner

## 2023-11-23 ENCOUNTER — Other Ambulatory Visit

## 2023-11-23 DIAGNOSIS — E7800 Pure hypercholesterolemia, unspecified: Secondary | ICD-10-CM

## 2023-11-23 DIAGNOSIS — R7989 Other specified abnormal findings of blood chemistry: Secondary | ICD-10-CM

## 2023-11-24 ENCOUNTER — Ambulatory Visit: Payer: Self-pay | Admitting: Family Medicine

## 2023-11-24 ENCOUNTER — Other Ambulatory Visit: Payer: Self-pay | Admitting: Family Medicine

## 2023-11-24 DIAGNOSIS — R7989 Other specified abnormal findings of blood chemistry: Secondary | ICD-10-CM

## 2023-11-24 DIAGNOSIS — E0590 Thyrotoxicosis, unspecified without thyrotoxic crisis or storm: Secondary | ICD-10-CM

## 2023-11-24 LAB — COMPLETE METABOLIC PANEL WITHOUT GFR
AG Ratio: 1.5 (calc) (ref 1.0–2.5)
ALT: 49 U/L — ABNORMAL HIGH (ref 6–29)
AST: 40 U/L — ABNORMAL HIGH (ref 10–35)
Albumin: 3.8 g/dL (ref 3.6–5.1)
Alkaline phosphatase (APISO): 287 U/L — ABNORMAL HIGH (ref 37–153)
BUN/Creatinine Ratio: 28 (calc) — ABNORMAL HIGH (ref 6–22)
BUN: 11 mg/dL (ref 7–25)
CO2: 24 mmol/L (ref 20–32)
Calcium: 9.6 mg/dL (ref 8.6–10.4)
Chloride: 108 mmol/L (ref 98–110)
Creat: 0.39 mg/dL — ABNORMAL LOW (ref 0.50–1.03)
Globulin: 2.5 g/dL (ref 1.9–3.7)
Glucose, Bld: 91 mg/dL (ref 65–99)
Potassium: 3.8 mmol/L (ref 3.5–5.3)
Sodium: 142 mmol/L (ref 135–146)
Total Bilirubin: 0.6 mg/dL (ref 0.2–1.2)
Total Protein: 6.3 g/dL (ref 6.1–8.1)

## 2023-11-24 LAB — TSH: TSH: <0.01 m[IU]/L — ABNORMAL LOW

## 2023-11-24 LAB — T4, FREE: Free T4: 3.1 ng/dL — ABNORMAL HIGH (ref 0.8–1.8)

## 2023-11-30 ENCOUNTER — Ambulatory Visit
Admission: RE | Admit: 2023-11-30 | Discharge: 2023-11-30 | Disposition: A | Discharge status: Home / Self Care | Source: Ambulatory Visit | Attending: Family Medicine | Admitting: Family Medicine

## 2023-11-30 DIAGNOSIS — R7989 Other specified abnormal findings of blood chemistry: Secondary | ICD-10-CM | POA: Insufficient documentation

## 2023-11-30 DIAGNOSIS — E0590 Thyrotoxicosis, unspecified without thyrotoxic crisis or storm: Secondary | ICD-10-CM | POA: Insufficient documentation

## 2023-12-01 ENCOUNTER — Ambulatory Visit: Payer: Self-pay | Admitting: Family Medicine

## 2023-12-01 ENCOUNTER — Encounter: Payer: Self-pay | Admitting: Family Medicine

## 2023-12-31 NOTE — Progress Notes (Signed)
 PCP:  Edman Marsa PARAS, DO   Chief Complaint  Patient presents with   Gynecologic Exam    Hot flashes, body feels warm.     HPI:      Yolanda Tran is a 53 y.o. [id number] who LMP was Patient's last menstrual period was 09/13/2022 (exact date)., presents today for her annual examination.  Her menses are absent for almost a year. No BTB, no dysmen. Does have VS sx and sleep disturbance; has upcoming eval for hyperthyroidism. On gabapentin  anyway. Hasn't done HRT.   Sex activity: single partner, contraception - tubal ligation. Uses lubricants for vaginal dryness. No pain/bleeding.  Last Pap: 10/21/22 Results were: no abnormalities /neg HPV DNA  Hx of STDs: none  Last mammogram: 01/10/23 Results were no abnormalities after addl views; repeat in 12 months There is a FH of breast cancer in her 2 mat aunts and mat 2nd cousin, genetic testing declined by pt in past  There is no FH of ovarian cancer. The patient does self-breast exams.  Tobacco use: The patient denies current or previous tobacco use. Alcohol use: none No drug use.  Exercise: min active  Colonoscopy: 7/21 with [facility]; repeat due after 10 yrs  She does get adequate calcium and occas Vitamin D  in her diet.  Labs with PCP  Past Medical History:  Diagnosis Date   Alkaline phosphatase elevation    Allergy    Family history of adverse reaction to anesthesia    Mother - BP drops with propofol    History of shingles 03/28/2019   Forehead   Hyperlipidemia    Ovarian cyst    Post herpetic neuralgia    Thrombocytopenia (HCC)    Vitamin D  deficiency disease     Past Surgical History:  Procedure Laterality Date   BREAST SURGERY  2003   tissue removed from breast   COLONOSCOPY WITH PROPOFOL  N/A 11/12/2019   Procedure: COLONOSCOPY WITH PROPOFOL ;  Surgeon: Janalyn Keene NOVAK, MD;  Location: [facility];  Service: Endoscopy;  Laterality: N/A;   TUBAL LIGATION  2002    Family History  Problem  Relation Age of Onset   Stroke Mother 70   Hypertension Mother    Heart disease Mother    Depression Mother    Diabetes Father    Heart disease Father    Congestive Heart Failure Father    Cancer Father        bone   Stroke Maternal Grandmother    Heart disease Maternal Grandfather    Diabetes Paternal Grandmother    Healthy Daughter    Healthy Son    Breast cancer Maternal Aunt        40s/50s   Colon cancer Maternal Aunt 60   Breast cancer Maternal Aunt        40s/50s   Breast cancer Cousin        mid 48s, has contact   COPD Neg Hx     Social History   Socioeconomic History   Marital status: Married    Spouse name: Not on file   Number of children: 2   Years of education: Not on file   Highest education level: Bachelor's degree (e.g., BA, AB, BS)  Occupational History   Not on file  Tobacco Use   Smoking status: Never   Smokeless tobacco: Never  Vaping Use   Vaping status: Never Used  Substance and Sexual Activity   Alcohol use: Yes    Comment: 2-3 monthly  Drug use: No   Sexual activity: Yes    Birth control/protection: Surgical    Comment: tubal ligation  Other Topics Concern   Not on file  Social History Narrative   Not on file   Social Drivers of Health   Financial Resource Strain: Low Risk  (09/12/2023)   Overall Financial Resource Strain (CARDIA)    Difficulty of Paying Living Expenses: Not very hard  Food Insecurity: No Food Insecurity (09/12/2023)   Hunger Vital Sign    Worried About Running Out of Food in the Last Year: Never true    Ran Out of Food in the Last Year: Never true  Transportation Needs: No Transportation Needs (09/12/2023)   PRAPARE - Administrator, Civil Service (Medical): No    Lack of Transportation (Non-Medical): No  Physical Activity: Insufficiently Active (09/12/2023)   Exercise Vital Sign    Days of Exercise per Week: 1 day    Minutes of Exercise per Session: 40 min  Stress: Stress Concern Present  (09/12/2023)   [employer] of Occupational Health - Occupational Stress Questionnaire    Feeling of Stress : To some extent  Social Connections: Socially Integrated (09/12/2023)   Social Connection and Isolation Panel    Frequency of Communication with Friends and Family: Twice a week    Frequency of Social Gatherings with Friends and Family: Once a week    Attends Religious Services: More than 4 times per year    Active Member of [employer] or Organizations: Yes    Attends Engineer, structural: More than 4 times per year    Marital Status: Married  Catering manager Violence: Not At Risk (01/03/2018)   Humiliation, Afraid, Rape, and Kick questionnaire    Fear of Current or Ex-Partner: No    Emotionally Abused: No    Physically Abused: No    Sexually Abused: No    Current Meds  Medication Sig   cetirizine (ZYRTEC) 10 MG tablet Take 10 mg by mouth daily.   Cholecalciferol (VITAMIN D -3 PO) Take by mouth daily.   Cyanocobalamin (VITAMIN B-12 PO) Take by mouth daily.   FERROUS SULFATE PO Take by mouth daily.   fluticasone  (FLONASE ) 50 MCG/ACT nasal spray Place 2 sprays into both nostrils daily. (Patient taking differently: Place 2 sprays into both nostrils 2 (two) times daily as needed.)   gabapentin  (NEURONTIN ) 100 MG capsule Take 1 capsule (100 mg total) by mouth 3 (three) times daily. (Patient taking differently: Take 100 mg by mouth 2 (two) times daily.)   gabapentin  (NEURONTIN ) 300 MG capsule Take 1 capsule (300 mg total) by mouth at bedtime.   Multiple Vitamins-Minerals (MULTIVITAMIN WOMENS 50+ ADV PO) Take by mouth daily.     ROS:  Review of Systems  Constitutional:  Positive for fatigue. Negative for fever and unexpected weight change.  Respiratory:  Negative for cough, shortness of breath and wheezing.   Cardiovascular:  Negative for chest pain, palpitations and leg swelling.  Gastrointestinal:  Negative for blood in stool, constipation, diarrhea, nausea and vomiting.   Endocrine: Negative for cold intolerance, heat intolerance and polyuria.  Genitourinary:  Negative for dyspareunia, dysuria, flank pain, frequency, genital sores, hematuria, menstrual problem, pelvic pain, urgency, vaginal bleeding, vaginal discharge and vaginal pain.  Musculoskeletal:  Negative for back pain, joint swelling and myalgias.  Skin:  Negative for rash.  Neurological:  Positive for dizziness. Negative for syncope, light-headedness, numbness and headaches.  Hematological:  Negative for adenopathy.  Psychiatric/Behavioral:  Negative for agitation,  confusion, sleep disturbance and suicidal ideas. The patient is not nervous/anxious.      Objective: BP 106/63   Pulse 99   Ht 5' 4 (1.626 m)   Wt 131 lb (59.4 kg)   LMP 09/13/2022 (Exact Date)   BMI 22.49 kg/m    Physical Exam Constitutional:      Appearance: She is well-developed.  Genitourinary:     Vulva normal.     Right Labia: No rash, tenderness or lesions.    Left Labia: No tenderness, lesions or rash.    No vaginal discharge, erythema or tenderness.      Right Adnexa: not tender and no mass present.    Left Adnexa: not tender and no mass present.    No cervical motion tenderness, friability or polyp.     Uterus is not enlarged or tender.  Breasts:    Right: No mass, nipple discharge, skin change or tenderness.     Left: No mass, nipple discharge, skin change or tenderness.  Neck:     Thyroid : No thyromegaly.  Cardiovascular:     Rate and Rhythm: Normal rate and regular rhythm.     Heart sounds: Normal heart sounds. No murmur heard. Pulmonary:     Effort: Pulmonary effort is normal.     Breath sounds: Normal breath sounds.  Abdominal:     Palpations: Abdomen is soft.     Tenderness: There is no abdominal tenderness. There is no guarding or rebound.  Musculoskeletal:        General: Normal range of motion.     Cervical back: Normal range of motion.  Lymphadenopathy:     Cervical: No cervical adenopathy.   Neurological:     General: No focal deficit present.     Mental Status: She is alert and oriented to person, place, and time.     Cranial Nerves: No cranial nerve deficit.  Skin:    General: Skin is warm and dry.  Psychiatric:        Mood and Affect: Mood normal.        Behavior: Behavior normal.        Thought Content: Thought content normal.        Judgment: Judgment normal.  Vitals reviewed.     Assessment/Plan: Encounter for annual routine gynecological examination  Encounter for screening mammogram for malignant neoplasm of breast - Plan: MM 3D SCREENING MAMMOGRAM BILATERAL BREAST; pt to schedule mammo  Family history of breast cancer - Plan: MM 3D SCREENING MAMMOGRAM BILATERAL BREAST; MyRisk testing discussed and pt to f/u if desires.   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 Discussed estroven vs veozah vs HRT. Pt will try estroven first; not interested in HRT.            GYN counsel breast self exam, mammography screening, adequate intake of calcium and vitamin D , diet and exercise     F/U  Return in about 1 year (around 01/01/2025).  Eliott Amparan B. Julee Stoll, PA-C 01/02/2024 2:34 PM

## 2024-01-02 ENCOUNTER — Ambulatory Visit (INDEPENDENT_AMBULATORY_CARE_PROVIDER_SITE_OTHER): Admitting: Obstetrics and Gynecology

## 2024-01-02 ENCOUNTER — Encounter: Payer: Self-pay | Admitting: Obstetrics and Gynecology

## 2024-01-02 VITALS — BP 106/63 | HR 99 | Ht 64.0 in | Wt 131.0 lb

## 2024-01-02 DIAGNOSIS — N951 Menopausal and female climacteric states: Secondary | ICD-10-CM

## 2024-01-02 DIAGNOSIS — Z803 Family history of malignant neoplasm of breast: Secondary | ICD-10-CM | POA: Diagnosis not present

## 2024-01-02 DIAGNOSIS — Z01419 Encounter for gynecological examination (general) (routine) without abnormal findings: Secondary | ICD-10-CM | POA: Diagnosis not present

## 2024-01-02 DIAGNOSIS — Z1231 Encounter for screening mammogram for malignant neoplasm of breast: Secondary | ICD-10-CM

## 2024-01-02 NOTE — Patient Instructions (Addendum)
 I value your feedback and you entrusting [place] with your care. If you get a [facility] patient survey, I would appreciate you taking the time to let us know about your experience today. Thank you!  [facility] ([place])--[phone]

## 2024-02-21 ENCOUNTER — Ambulatory Visit
Admission: RE | Admit: 2024-02-21 | Discharge: 2024-02-21 | Disposition: A | Discharge status: Home / Self Care | Source: Ambulatory Visit | Attending: Obstetrics and Gynecology | Admitting: Obstetrics and Gynecology

## 2024-02-21 DIAGNOSIS — Z1231 Encounter for screening mammogram for malignant neoplasm of breast: Secondary | ICD-10-CM | POA: Diagnosis present

## 2024-02-21 DIAGNOSIS — Z803 Family history of malignant neoplasm of breast: Secondary | ICD-10-CM | POA: Insufficient documentation

## 2024-02-25 ENCOUNTER — Ambulatory Visit: Payer: Self-pay | Admitting: Obstetrics and Gynecology
